# Patient Record
Sex: Male | Born: 1975 | Race: Black or African American | Hispanic: No | Marital: Single | Smoking: Light tobacco smoker
Health system: Southern US, Community
[De-identification: ages and names within clinical notes are randomized; demographics above are authoritative.]

## PROBLEM LIST (undated history)

## (undated) DIAGNOSIS — B20 Human immunodeficiency virus [HIV] disease: Secondary | ICD-10-CM

## (undated) DIAGNOSIS — B029 Zoster without complications: Secondary | ICD-10-CM

## (undated) HISTORY — DX: Human immunodeficiency virus (HIV) disease: B20

---

## 2001-05-25 ENCOUNTER — Emergency Department (HOSPITAL_COMMUNITY): Admission: EM | Admit: 2001-05-25 | Discharge: 2001-05-25 | Payer: Self-pay

## 2002-01-12 ENCOUNTER — Emergency Department (HOSPITAL_COMMUNITY): Admission: EM | Admit: 2002-01-12 | Discharge: 2002-01-13 | Payer: Self-pay | Admitting: Emergency Medicine

## 2004-10-18 ENCOUNTER — Emergency Department (HOSPITAL_COMMUNITY): Admission: EM | Admit: 2004-10-18 | Discharge: 2004-10-18 | Payer: Self-pay | Admitting: Emergency Medicine

## 2004-12-09 ENCOUNTER — Emergency Department (HOSPITAL_COMMUNITY): Admission: EM | Admit: 2004-12-09 | Discharge: 2004-12-09 | Payer: Self-pay | Admitting: Emergency Medicine

## 2005-01-01 ENCOUNTER — Emergency Department (HOSPITAL_COMMUNITY): Admission: EM | Admit: 2005-01-01 | Discharge: 2005-01-01 | Payer: Self-pay | Admitting: Emergency Medicine

## 2009-10-30 ENCOUNTER — Emergency Department (HOSPITAL_COMMUNITY): Admission: EM | Admit: 2009-10-30 | Discharge: 2009-10-31 | Payer: Self-pay | Admitting: Emergency Medicine

## 2013-08-01 ENCOUNTER — Ambulatory Visit: Payer: Self-pay | Attending: Internal Medicine | Admitting: Internal Medicine

## 2013-08-01 ENCOUNTER — Encounter: Payer: Self-pay | Admitting: Internal Medicine

## 2013-08-01 VITALS — BP 148/101 | HR 81 | Temp 98.4°F | Resp 16 | Ht 67.0 in | Wt 138.0 lb

## 2013-08-01 DIAGNOSIS — A64 Unspecified sexually transmitted disease: Secondary | ICD-10-CM

## 2013-08-01 DIAGNOSIS — Z21 Asymptomatic human immunodeficiency virus [HIV] infection status: Secondary | ICD-10-CM

## 2013-08-01 DIAGNOSIS — Z09 Encounter for follow-up examination after completed treatment for conditions other than malignant neoplasm: Secondary | ICD-10-CM | POA: Insufficient documentation

## 2013-08-01 LAB — CBC WITH DIFFERENTIAL/PLATELET
Basophils Absolute: 0 10*3/uL (ref 0.0–0.1)
Basophils Relative: 0 % (ref 0–1)
Eosinophils Absolute: 0.1 10*3/uL (ref 0.0–0.7)
Eosinophils Relative: 4 % (ref 0–5)
HCT: 42.6 % (ref 39.0–52.0)
Hemoglobin: 14.6 g/dL (ref 13.0–17.0)
Lymphocytes Relative: 46 % (ref 12–46)
Lymphs Abs: 1.5 10*3/uL (ref 0.7–4.0)
MCH: 28.2 pg (ref 26.0–34.0)
MCHC: 34.3 g/dL (ref 30.0–36.0)
MCV: 82.4 fL (ref 78.0–100.0)
Monocytes Absolute: 0.4 10*3/uL (ref 0.1–1.0)
Monocytes Relative: 13 % — ABNORMAL HIGH (ref 3–12)
Neutro Abs: 1.2 10*3/uL — ABNORMAL LOW (ref 1.7–7.7)
Neutrophils Relative %: 37 % — ABNORMAL LOW (ref 43–77)
Platelets: 280 10*3/uL (ref 150–400)
RBC: 5.17 MIL/uL (ref 4.22–5.81)
RDW: 13.4 % (ref 11.5–15.5)
WBC: 3.2 10*3/uL — ABNORMAL LOW (ref 4.0–10.5)

## 2013-08-01 LAB — POCT URINALYSIS DIPSTICK
Bilirubin, UA: NEGATIVE
Glucose, UA: NEGATIVE
KETONES UA: NEGATIVE
LEUKOCYTES UA: NEGATIVE
Nitrite, UA: NEGATIVE
PROTEIN UA: 3
Spec Grav, UA: <=1.005
Urobilinogen, UA: 1
pH, UA: >=9

## 2013-08-01 MED ORDER — CEFTRIAXONE SODIUM 1 G IJ SOLR: 250.0000 mg | INTRAMUSCULAR | Status: DC

## 2013-08-01 MED ORDER — CEFTRIAXONE SODIUM 1 G IJ SOLR
250.0000 mg | Freq: Once | INTRAMUSCULAR | Status: AC
  Administered 2013-08-01: 250 mg via INTRAMUSCULAR

## 2013-08-01 MED ORDER — DOXYCYCLINE HYCLATE 100 MG PO TABS: 100.0000 mg | ORAL_TABLET | Freq: Two times a day (BID) | ORAL | Status: AC

## 2013-08-01 MED ORDER — CEFTRIAXONE SODIUM 250 MG IJ SOLR: 250.0000 mg | Freq: Once | INTRAMUSCULAR | Status: DC

## 2013-08-01 NOTE — Addendum Note (Signed)
Addended by: Lestine MountJUAREZ, Mohogany Toppins L on: 08/01/2013 04:51 PM   Modules accepted: Orders

## 2013-08-01 NOTE — Progress Notes (Signed)
Pt is here to establish care. Pt is here to check to see if he might have an STD.

## 2013-08-01 NOTE — Addendum Note (Signed)
Addended by: Allayne StackWINFREE, Satoya Feeley R on: 08/01/2013 03:24 PM   Modules accepted: Orders, Medications

## 2013-08-01 NOTE — Addendum Note (Signed)
Addended by: Lestine MountJUAREZ, Avarie Tavano L on: 08/01/2013 04:46 PM   Modules accepted: Orders

## 2013-08-01 NOTE — Progress Notes (Signed)
Patient ID: Stephen Odom, male   DOB: [birth year], 38 y.o.   MRN: [id number]   CC:  HPI:  38 year old male concerned about sexually transmitted disease. He is gay, and has been in a monogamous relationship since September. Before that he was with a different person, he is not sure if he contracted gonorrhea but his sexual partner was diagnosed with gonorrhea. No skin lesions, no abnormal anal or penile discharge. No fever, no dysuria   No Known Allergies History reviewed. No pertinent past medical history. No current outpatient prescriptions on file prior to visit.   No current facility-administered medications on file prior to visit.   Family History  Problem Relation Age of Onset  . Diabetes Mother   . Hypertension Mother   . Diabetes Maternal Grandmother    History   Social History  . Marital Status: Married    Spouse Name: N/A    Number of Children: N/A  . Years of Education: N/A   Occupational History  . Not on file.   Social History Main Topics  . Smoking status: Light Tobacco Smoker -- 0.30 packs/day    Types: Cigarettes  . Smokeless tobacco: Not on file  . Alcohol Use: 0.5 oz/week    1 drink(s) per week  . Drug Use: 3.00 per week    Special: Marijuana  . Sexual Activity: Yes   Other Topics Concern  . Not on file   Social History Narrative  . No narrative on file    Review of Systems  Constitutional: As in history of present illness  HENT: Negative for ear pain, nosebleeds, congestion, facial swelling, rhinorrhea, neck pain, neck stiffness and ear discharge.   Eyes: Negative for pain, discharge, redness, itching and visual disturbance.  Respiratory: Negative for cough, choking, chest tightness, shortness of breath, wheezing and stridor.   Cardiovascular: Negative for chest pain, palpitations and leg swelling.  Gastrointestinal: Negative for abdominal distention.  Genitourinary: Negative for dysuria, urgency, frequency, hematuria, flank pain,  decreased urine volume, difficulty urinating and dyspareunia.  Musculoskeletal: Negative for back pain, joint swelling, arthralgias and gait problem.  Neurological: Negative for dizziness, tremors, seizures, syncope, facial asymmetry, speech difficulty, weakness, light-headedness, numbness and headaches.  Hematological: Negative for adenopathy. Does not bruise/bleed easily.  Psychiatric/Behavioral: Negative for hallucinations, behavioral problems, confusion, dysphoric mood, decreased concentration and agitation.    Objective:   Filed Vitals:   08/01/13 1429  BP: 148/101  Pulse: 81  Temp: 98.4 F (36.9 C)  Resp: 16    Physical Exam  Constitutional: Appears well-developed and well-nourished. No distress.  HENT: Normocephalic. External right and left ear normal. Oropharynx is clear and moist.  Eyes: Conjunctivae and EOM are normal. PERRLA, no scleral icterus.  Neck: Normal ROM. Neck supple. No JVD. No tracheal deviation. No thyromegaly.  CVS: RRR, S1/S2 +, no murmurs, no gallops, no carotid bruit.  Pulmonary: Effort and breath sounds normal, no stridor, rhonchi, wheezes, rales.  Abdominal: Soft. BS +,  no distension, tenderness, rebound or guarding.  Musculoskeletal: Normal range of motion. No edema and no tenderness.  Lymphadenopathy: No lymphadenopathy noted, cervical, inguinal. Neuro: Alert. Normal reflexes, muscle tone coordination. No cranial nerve deficit. Skin: Skin is warm and dry. No rash noted. Not diaphoretic. No erythema. No pallor.  Psychiatric: Normal mood and affect. Behavior, judgment, thought content normal.   No results found for this basename: WBC, HGB, HCT, MCV, PLT   No results found for this basename: CREATININE, BUN, NA, K, CL, CO2    No results  found for this basename: HGBA1C   Lipid Panel  No results found for this basename: chol, trig, hdl, cholhdl, vldl, ldlcalc       Assessment and plan:   There are no active problems to display for this  patient.  Sexually transmitted disease? Obtain chlamydia/gonorrhea PCR Urine dipstick HIV antibody CBC Rocephin 250 mg IM x1 Doxycycline 100 mg by mouth twice a day x7 days  Patient advised to use contact contraception  Followup in 6 months       The patient was given clear instructions to go to ER or return to medical center if symptoms don't improve, worsen or new problems develop. The patient verbalized understanding. The patient was told to call to get any lab results if not heard anything in the next week.

## 2013-08-01 NOTE — Addendum Note (Signed)
Addended by: Allayne StackWINFREE, Kaisyn Millea R on: 08/01/2013 03:21 PM   Modules accepted: Orders

## 2013-08-01 NOTE — Addendum Note (Signed)
Addended by: Allayne StackWINFREE, Ciera Beckum R on: 08/01/2013 03:27 PM   Modules accepted: Orders

## 2013-08-02 LAB — COMPLETE METABOLIC PANEL WITH GFR
ALT: 10 U/L (ref 0–53)
AST: 17 U/L (ref 0–37)
Albumin: 4.3 g/dL (ref 3.5–5.2)
Alkaline Phosphatase: 49 U/L (ref 39–117)
BUN: 14 mg/dL (ref 6–23)
CO2: 31 mEq/L (ref 19–32)
Calcium: 9.6 mg/dL (ref 8.4–10.5)
Chloride: 101 mEq/L (ref 96–112)
Creat: 0.83 mg/dL (ref 0.50–1.35)
GFR, Est African American: >89 mL/min
GFR, Est Non African American: >89 mL/min
Glucose, Bld: 71 mg/dL (ref 70–99)
Potassium: 4.5 mEq/L (ref 3.5–5.3)
Sodium: 138 mEq/L (ref 135–145)
Total Bilirubin: 0.6 mg/dL (ref 0.3–1.2)
Total Protein: 7.9 g/dL (ref 6.0–8.3)

## 2013-08-02 LAB — HIV ANTIBODY (ROUTINE TESTING W REFLEX): HIV: REACTIVE

## 2013-08-06 LAB — HIV 1/2 CONFIRMATION
HIV-1 antibody: POSITIVE — AB
HIV-2 Ab: NEGATIVE

## 2013-08-14 ENCOUNTER — Telehealth: Payer: Self-pay | Admitting: Emergency Medicine

## 2013-08-14 MED ORDER — CETIRIZINE HCL 5 MG/5ML PO SYRP: 5.0000 mg | ORAL_SOLUTION | Freq: Every day | ORAL | Status: DC

## 2013-08-14 NOTE — Telephone Encounter (Signed)
Spoke with pt to discuss Hiv lab results and further instructions. Pt has walk in appt 08/15/13 with me

## 2013-08-14 NOTE — Telephone Encounter (Signed)
Message copied by Darlis LoanSMITH, JILL D on Tue Aug 14, 2013  2:01 PM ------      Message from: Susie CassetteABROL MD, Germain OsgoodNAYANA      Created: Tue Aug 14, 2013  1:41 PM       Notify patient of the patient's HIV antibody test is positive, confirmation test is also positive, he has been referred to infectious disease on an urgent basis. Please provide him nora's contact number, so that he can quickly arrange for this appointment ------

## 2013-08-14 NOTE — Telephone Encounter (Signed)
Message copied by Darlis LoanSMITH, JILL D on Tue Aug 14, 2013  1:57 PM ------      Message from: Susie CassetteABROL MD, [facility]      Created: Tue Aug 14, 2013  1:41 PM       Notify patient of the patient's HIV antibody test is positive, confirmation test is also positive, he has been referred to infectious disease on an urgent basis. Please provide him nora's contact number, so that he can quickly arrange for this appointment ------

## 2013-08-14 NOTE — Addendum Note (Signed)
Addended by: Susie CassetteABROL MD, Germain OsgoodNAYANA on: 08/14/2013 01:40 PM   Modules accepted: Orders

## 2013-08-14 NOTE — Telephone Encounter (Signed)
Left message on voicemail for pt to call clinic.

## 2013-08-15 ENCOUNTER — Ambulatory Visit: Payer: Self-pay

## 2013-08-15 ENCOUNTER — Telehealth: Payer: Self-pay | Admitting: Emergency Medicine

## 2013-08-15 NOTE — Telephone Encounter (Signed)
Left message to reschedule office visit appt to discuss lab results

## 2013-08-16 ENCOUNTER — Encounter: Payer: Self-pay | Admitting: Internal Medicine

## 2013-08-16 NOTE — Progress Notes (Unsigned)
Pt here to discuss lab results Lab results and resource information given  Infectious disease referral placed Pt informed to tell sexual partner

## 2013-08-16 NOTE — Telephone Encounter (Signed)
Error

## 2013-08-22 ENCOUNTER — Ambulatory Visit: Payer: Self-pay

## 2013-09-07 ENCOUNTER — Telehealth: Payer: Self-pay

## 2013-09-07 NOTE — Telephone Encounter (Signed)
[facility]  There is no documentation that  verifies patient has been notified of HIV status.  I will contact the office for more information.  Once this is documented I will contact the patient for a new intake with [facility].   Laurell Josephsammy K Jaque Dacy, RN

## 2013-09-17 ENCOUNTER — Telehealth: Payer: Self-pay | Admitting: Emergency Medicine

## 2013-12-31 ENCOUNTER — Telehealth: Payer: Self-pay

## 2013-12-31 DIAGNOSIS — Z21 Asymptomatic human immunodeficiency virus [HIV] infection status: Secondary | ICD-10-CM | POA: Insufficient documentation

## 2013-12-31 NOTE — Telephone Encounter (Signed)
Multiple attempts to reach patient via phone.  Number listed  [phone] does not belong to patient.  A male was newly assigned this nmber and asked that it be removed from his records.   I will assign [facility] Counselor to look for patient.   Laurell Josephs, RN

## 2014-01-29 ENCOUNTER — Ambulatory Visit: Payer: Self-pay | Admitting: Internal Medicine

## 2015-02-09 ENCOUNTER — Encounter (HOSPITAL_COMMUNITY): Payer: Self-pay

## 2015-02-09 ENCOUNTER — Emergency Department (HOSPITAL_COMMUNITY)
Admission: EM | Admit: 2015-02-09 | Discharge: 2015-02-09 | Disposition: A | Discharge status: Home / Self Care | Payer: Self-pay | Attending: Emergency Medicine | Admitting: Emergency Medicine

## 2015-02-09 DIAGNOSIS — M545 Low back pain, unspecified: Secondary | ICD-10-CM

## 2015-02-09 DIAGNOSIS — Z720 Tobacco use: Secondary | ICD-10-CM | POA: Insufficient documentation

## 2015-02-09 DIAGNOSIS — Z79899 Other long term (current) drug therapy: Secondary | ICD-10-CM | POA: Insufficient documentation

## 2015-02-09 DIAGNOSIS — R05 Cough: Secondary | ICD-10-CM | POA: Insufficient documentation

## 2015-02-09 MED ORDER — CYCLOBENZAPRINE HCL 10 MG PO TABS: 10.0000 mg | ORAL_TABLET | Freq: Two times a day (BID) | ORAL | Status: DC | PRN

## 2015-02-09 MED ORDER — CYCLOBENZAPRINE HCL 10 MG PO TABS
10.0000 mg | ORAL_TABLET | Freq: Once | ORAL | Status: AC
  Administered 2015-02-09: 10 mg via ORAL
  Filled 2015-02-09: qty 1

## 2015-02-09 MED ORDER — IBUPROFEN 800 MG PO TABS
800.0000 mg | ORAL_TABLET | Freq: Once | ORAL | Status: AC
  Administered 2015-02-09: 800 mg via ORAL
  Filled 2015-02-09: qty 1

## 2015-02-09 MED ORDER — HYDROCODONE-ACETAMINOPHEN 5-325 MG PO TABS
1.0000 | ORAL_TABLET | ORAL | Status: DC | PRN
Start: 2015-02-09 — End: 2015-02-18

## 2015-02-09 MED ORDER — GUAIFENESIN 100 MG/5ML PO SYRP: 100.0000 mg | ORAL_SOLUTION | ORAL | Status: DC | PRN

## 2015-02-09 MED ORDER — IBUPROFEN 800 MG PO TABS: 800.0000 mg | ORAL_TABLET | Freq: Three times a day (TID) | ORAL | Status: DC

## 2015-02-09 NOTE — Discharge Instructions (Signed)
Back Exercises Back exercises help treat and prevent back injuries. The goal is to increase your strength in your belly (abdominal) and back muscles. These exercises can also help with flexibility. Start these exercises when told by your doctor. HOME CARE Back exercises include: Pelvic Tilt.  Lie on your back with your knees bent. Tilt your pelvis until the lower part of your back is against the floor. Hold this position 5 to 10 sec. Repeat this exercise 5 to 10 times. Knee to Chest.  Pull 1 knee up against your chest and hold for 20 to 30 seconds. Repeat this with the other knee. This may be done with the other leg straight or bent, whichever feels better. Then, pull both knees up against your chest. Sit-Ups or Curl-Ups.  Bend your knees 90 degrees. Start with tilting your pelvis, and do a partial, slow sit-up. Only lift your upper half 30 to 45 degrees off the floor. Take at least 2 to 3 seonds for each sit-up. Do not do sit-ups with your knees out straight. If partial sit-ups are difficult, simply do the above but with only tightening your belly (abdominal) muscles and holding it as told. Hip-Lift.  Lie on your back with your knees flexed 90 degrees. Push down with your feet and shoulders as you raise your hips 2 inches off the floor. Hold for 10 seconds, repeat 5 to 10 times. Back Arches.  Lie on your stomach. Prop yourself up on bent elbows. Slowly press on your hands, causing an arch in your low back. Repeat 3 to 5 times. Shoulder-Lifts.  Lie face down with arms beside your body. Keep hips and belly pressed to floor as you slowly lift your head and shoulders off the floor. Do not overdo your exercises. Be careful in the beginning. Exercises may cause you some mild back discomfort. If the pain lasts for more than 15 minutes, stop the exercises until you see your doctor. Improvement with exercise for back problems is slow.  Document Released: 08/14/2010 Document Revised: 10/04/2011  Document Reviewed: 05/13/2011 [facility] Patient Information 2015 [place], [place]. This information is not intended to replace advice given to you by your health care provider. Make sure you discuss any questions you have with your health care provider. Back Pain, Adult Low back pain is very common. About 1 in 5 people have back pain.The cause of low back pain is rarely dangerous. The pain often gets better over time.About half of people with a sudden onset of back pain feel better in just 2 weeks. About 8 in 10 people feel better by 6 weeks.  CAUSES Some common causes of back pain include:  Strain of the muscles or ligaments supporting the spine.  Wear and tear (degeneration) of the spinal discs.  Arthritis.  Direct injury to the back. DIAGNOSIS Most of the time, the direct cause of low back pain is not known.However, back pain can be treated effectively even when the exact cause of the pain is unknown.Answering your caregiver's questions about your overall health and symptoms is one of the most accurate ways to make sure the cause of your pain is not dangerous. If your caregiver needs more information, he or she may order lab work or imaging tests (X-rays or MRIs).However, even if imaging tests show changes in your back, this usually does not require surgery. HOME CARE INSTRUCTIONS For many people, back pain returns.Since low back pain is rarely dangerous, it is often a condition that people can learn to [facility] their  own.   Remain active. It is stressful on the back to sit or stand in one place. Do not sit, drive, or stand in one place for more than 30 minutes at a time. Take short walks on level surfaces as soon as pain allows.Try to increase the length of time you walk each day.  Do not stay in bed.Resting more than 1 or 2 days can delay your recovery.  Do not avoid exercise or work.Your body is made to move.It is not dangerous to be active, even though your back may hurt.Your  back will likely heal faster if you return to being active before your pain is gone.  Pay attention to your body when you bend and lift. Many people have less discomfortwhen lifting if they bend their knees, keep the load close to their bodies,and avoid twisting. Often, the most comfortable positions are those that put less stress on your recovering back.  Find a comfortable position to sleep. Use a firm mattress and lie on your side with your knees slightly bent. If you lie on your back, put a pillow under your knees.  Only take over-the-counter or prescription medicines as directed by your caregiver. Over-the-counter medicines to reduce pain and inflammation are often the most helpful.Your caregiver may prescribe muscle relaxant drugs.These medicines help dull your pain so you can more quickly return to your normal activities and healthy exercise.  Put ice on the injured area.  Put ice in a plastic bag.  Place a towel between your skin and the bag.  Leave the ice on for 15-20 minutes, 03-04 times a day for the first 2 to 3 days. After that, ice and heat may be alternated to reduce pain and spasms.  Ask your caregiver about trying back exercises and gentle massage. This may be of some benefit.  Avoid feeling anxious or stressed.Stress increases muscle tension and can worsen back pain.It is important to recognize when you are anxious or stressed and learn ways to manage it.Exercise is a great option. SEEK MEDICAL CARE IF:  You have pain that is not relieved with rest or medicine.  You have pain that does not improve in 1 week.  You have new symptoms.  You are generally not feeling well. SEEK IMMEDIATE MEDICAL CARE IF:   You have pain that radiates from your back into your legs.  You develop new bowel or bladder control problems.  You have unusual weakness or numbness in your arms or legs.  You develop nausea or vomiting.  You develop abdominal pain.  You feel  faint. Document Released: 07/12/2005 Document Revised: 01/11/2012 Document Reviewed: 11/13/2013 [facility] Patient Information 2015 [place], [place]. This information is not intended to replace advice given to you by your health care provider. Make sure you discuss any questions you have with your health care provider. Cryotherapy Cryotherapy means treatment with cold. Ice or gel packs can be used to reduce both pain and swelling. Ice is the most helpful within the first 24 to 48 hours after an injury or flare-up from overusing a muscle or joint. Sprains, strains, spasms, burning pain, shooting pain, and aches can all be eased with ice. Ice can also be used when recovering from surgery. Ice is effective, has very few side effects, and is safe for most people to use. PRECAUTIONS  Ice is not a safe treatment option for people with:  Raynaud phenomenon. This is a condition affecting small blood vessels in the extremities. Exposure to cold may cause your problems  to return.  Cold hypersensitivity. There are many forms of cold hypersensitivity, including:  Cold urticaria. Red, itchy hives appear on the skin when the tissues begin to warm after being iced.  Cold erythema. This is a red, itchy rash caused by exposure to cold.  Cold hemoglobinuria. Red blood cells break down when the tissues begin to warm after being iced. The hemoglobin that carry oxygen are passed into the urine because they cannot combine with blood proteins fast enough.  Numbness or altered sensitivity in the area being iced. If you have any of the following conditions, do not use ice until you have discussed cryotherapy with your caregiver:  Heart conditions, such as arrhythmia, angina, or chronic heart disease.  High blood pressure.  Healing wounds or open skin in the area being iced.  Current infections.  Rheumatoid arthritis.  Poor circulation.  Diabetes. Ice slows the blood flow in the region it is applied. This is  beneficial when trying to stop inflamed tissues from spreading irritating chemicals to surrounding tissues. However, if you expose your skin to cold temperatures for too long or without the proper protection, you can damage your skin or nerves. Watch for signs of skin damage due to cold. HOME CARE INSTRUCTIONS Follow these tips to use ice and cold packs safely.  Place a dry or damp towel between the ice and skin. A damp towel will cool the skin more quickly, so you may need to shorten the time that the ice is used.  For a more rapid response, add gentle compression to the ice.  Ice for no more than 10 to 20 minutes at a time. The bonier the area you are icing, the less time it will take to get the benefits of ice.  Check your skin after 5 minutes to make sure there are no signs of a poor response to cold or skin damage.  Rest 20 minutes or more between uses.  Once your skin is numb, you can end your treatment. You can test numbness by very lightly touching your skin. The touch should be so light that you do not see the skin dimple from the pressure of your fingertip. When using ice, most people will feel these normal sensations in this order: cold, burning, aching, and numbness.  Do not use ice on someone who cannot communicate their responses to pain, such as small children or people with dementia. HOW TO MAKE AN ICE PACK Ice packs are the most common way to use ice therapy. Other methods include ice massage, ice baths, and cryosprays. Muscle creams that cause a cold, tingly feeling do not offer the same benefits that ice offers and should not be used as a substitute unless recommended by your caregiver. To make an ice pack, do one of the following:  Place crushed ice or a bag of frozen vegetables in a sealable plastic bag. Squeeze out the excess air. Place this bag inside another plastic bag. Slide the bag into a pillowcase or place a damp towel between your skin and the bag.  Mix 3 parts  water with 1 part rubbing alcohol. Freeze the mixture in a sealable plastic bag. When you remove the mixture from the freezer, it will be slushy. Squeeze out the excess air. Place this bag inside another plastic bag. Slide the bag into a pillowcase or place a damp towel between your skin and the bag. SEEK MEDICAL CARE IF:  You develop white spots on your skin. This may give the skin  a blotchy (mottled) appearance.  Your skin turns blue or pale.  Your skin becomes waxy or hard.  Your swelling gets worse. MAKE SURE YOU:   Understand these instructions.  Will watch your condition.  Will get help right away if you are not doing well or get worse. Document Released: 03/08/2011 Document Revised: 11/26/2013 Document Reviewed: 03/08/2011 [facility] Patient Information 2015 [place], [place]. This information is not intended to replace advice given to you by your health care provider. Make sure you discuss any questions you have with your health care provider.

## 2015-02-09 NOTE — ED Provider Notes (Signed)
CSN: [id number]     Arrival date & time 02/09/15  1009 History   First MD Initiated Contact with Patient 02/09/15 1026     Chief Complaint  Patient presents with  . Back Pain     (Consider location/radiation/quality/duration/timing/severity/associated sxs/prior Treatment) HPI Comments: Patient presents with low back pain x 2 days. Patient states the back pain started when he developed a cough. The pain is located in the lumbar region and does not radiate. Pain is a 7/10 and constant, but is worsened while coughing and increases to a 10/10. He has tried Advil with no relief. He denies any trauma. He denies numbness, tingling, shooting pain and bowel or bladder dysfunction.   Patient is a 39 y.o. male presenting with back pain. The history is provided by the patient.  Back Pain Location:  Lumbar spine Radiates to:  Does not radiate Duration:  2 days Timing:  Constant Chronicity:  New Context: not falling   Relieved by:  Nothing Worsened by:  Coughing Associated symptoms: no dysuria, no numbness and no weakness     History reviewed. No pertinent past medical history. No past surgical history on file. Family History  Problem Relation Age of Onset  . Diabetes Mother   . Hypertension Mother   . Diabetes Maternal Grandmother    History  Substance Use Topics  . Smoking status: Light Tobacco Smoker -- 0.30 packs/day    Types: Cigarettes  . Smokeless tobacco: Not on file  . Alcohol Use: 0.5 oz/week    1 drink(s) per week    Review of Systems  Respiratory: Positive for cough.   Gastrointestinal:       Negative for bowel dysfunction  Genitourinary: Negative for dysuria and enuresis.  Musculoskeletal: Positive for back pain.  Neurological: Negative for weakness and numbness.      Allergies  Review of patient's allergies indicates no known allergies.  Home Medications   Prior to Admission medications   Medication Sig Start Date End Date Taking? Authorizing Provider   cetirizine HCl (ZYRTEC) 5 MG/5ML SYRP Take 5 mLs (5 mg total) by mouth daily. 08/14/13   Richarda OverlieNayana Abrol, MD   BP 141/84 mmHg  Pulse 88  Temp(Src) 98.7 F (37.1 C) (Oral)  Resp 20  SpO2 97% Physical Exam  Constitutional: He is oriented to person, place, and time. He appears well-developed and well-nourished.  HENT:  Head: Normocephalic and atraumatic.  Cardiovascular: Normal rate, regular rhythm and normal heart sounds.   Pulmonary/Chest: Effort normal and breath sounds normal.  Abdominal: Soft. There is no tenderness.  Musculoskeletal: He exhibits tenderness (along the spinous process and paravertebral muscles in the lumbar region).  Neurological: He is alert and oriented to person, place, and time. He has normal reflexes.  Reflexes are equal bilateral LE's. Ambulatory without difficulty.    ED Course  Procedures (including critical care time) Labs Review Labs Reviewed - No data to display  Imaging Review No results found.   EKG Interpretation None      MDM   Final diagnoses:  None    1. Low back pain  No neurologic deficits on exam. Pain reproducible on exam, suspect muscular strain vs radiculopathy. Will treat conservatively with medications and rest. Ortho referral provided.    Elpidio AnisShari Jianni Batten, PA-C 02/09/15 1516  Lorre NickAnthony Allen, MD 02/11/15 1524

## 2015-02-09 NOTE — ED Notes (Signed)
He states he has mild uri sx, and with much soughing his low back is beginning to hurt.  He is in no distress.

## 2015-02-18 ENCOUNTER — Emergency Department (HOSPITAL_COMMUNITY)
Admission: EM | Admit: 2015-02-18 | Discharge: 2015-02-18 | Disposition: A | Discharge status: Home / Self Care | Payer: Self-pay | Attending: Emergency Medicine | Admitting: Emergency Medicine

## 2015-02-18 ENCOUNTER — Encounter (HOSPITAL_COMMUNITY): Payer: Self-pay | Admitting: Emergency Medicine

## 2015-02-18 DIAGNOSIS — Z720 Tobacco use: Secondary | ICD-10-CM | POA: Insufficient documentation

## 2015-02-18 DIAGNOSIS — M545 Low back pain: Secondary | ICD-10-CM | POA: Insufficient documentation

## 2015-02-18 DIAGNOSIS — B029 Zoster without complications: Secondary | ICD-10-CM | POA: Insufficient documentation

## 2015-02-18 DIAGNOSIS — D849 Immunodeficiency, unspecified: Secondary | ICD-10-CM | POA: Insufficient documentation

## 2015-02-18 DIAGNOSIS — Z21 Asymptomatic human immunodeficiency virus [HIV] infection status: Secondary | ICD-10-CM | POA: Insufficient documentation

## 2015-02-18 DIAGNOSIS — B20 Human immunodeficiency virus [HIV] disease: Secondary | ICD-10-CM

## 2015-02-18 MED ORDER — ACYCLOVIR 400 MG PO TABS: 800.0000 mg | ORAL_TABLET | Freq: Every day | ORAL | Status: DC

## 2015-02-18 MED ORDER — HYDROCODONE-ACETAMINOPHEN 5-325 MG PO TABS: 1.0000 | ORAL_TABLET | ORAL | Status: DC | PRN

## 2015-02-18 MED ORDER — NAPROXEN 250 MG PO TABS: 250.0000 mg | ORAL_TABLET | Freq: Two times a day (BID) | ORAL | Status: DC

## 2015-02-18 MED ORDER — IBUPROFEN 800 MG PO TABS
800.0000 mg | ORAL_TABLET | Freq: Once | ORAL | Status: AC
  Administered 2015-02-18: 800 mg via ORAL
  Filled 2015-02-18: qty 1

## 2015-02-18 MED ORDER — HYDROCODONE-ACETAMINOPHEN 5-325 MG PO TABS
1.0000 | ORAL_TABLET | Freq: Once | ORAL | Status: AC
  Administered 2015-02-18: 1 via ORAL
  Filled 2015-02-18: qty 1

## 2015-02-18 NOTE — ED Notes (Signed)
Pt reports shingles rash for last month. Was here Sunday for same and was given rx for pain medication. Ran out of medication. Pt has un-scabbed over blisters on R flank. Gave pt mask to wear.

## 2015-02-18 NOTE — Discharge Instructions (Signed)
Shingles °Shingles (herpes zoster) is an infection that is caused by the same virus that causes chickenpox (varicella). The infection causes a painful skin rash and fluid-filled blisters, which eventually break open, crust over, and heal. It may occur in any area of the body, but it usually affects only one side of the body or face. The pain of shingles usually lasts about 1 month. However, some people with shingles may develop long-term (chronic) pain in the affected area of the body. °Shingles often occurs many years after the person had chickenpox. It is more common: °· In people older than 50 years. °· In people with weakened immune systems, such as those with HIV, AIDS, or cancer. °· In people taking medicines that weaken the immune system, such as transplant medicines. °· In people under great stress. °CAUSES  °Shingles is caused by the varicella zoster virus (VZV), which also causes chickenpox. After a person is infected with the virus, it can remain in the person's body for years in an inactive state (dormant). To cause shingles, the virus reactivates and breaks out as an infection in a nerve root. °The virus can be spread from person to person (contagious) through contact with open blisters of the shingles rash. It will only spread to people who have not had chickenpox. When these people are exposed to the virus, they may develop chickenpox. They will not develop shingles. Once the blisters scab over, the person is no longer contagious and cannot spread the virus to others. °SIGNS AND SYMPTOMS  °Shingles shows up in stages. The initial symptoms may be pain, itching, and tingling in an area of the skin. This pain is usually described as burning, stabbing, or throbbing. In a few days or weeks, a painful red rash will appear in the area where the pain, itching, and tingling were felt. The rash is usually on one side of the body in a band or belt-like pattern. Then, the rash usually turns into fluid-filled  blisters. They will scab over and dry up in approximately 2-3 weeks. °Flu-like symptoms may also occur with the initial symptoms, the rash, or the blisters. These may include: °· Fever. °· Chills. °· Headache. °· Upset stomach. °DIAGNOSIS  °Your health care provider will perform a skin exam to diagnose shingles. Skin scrapings or fluid samples may also be taken from the blisters. This sample will be examined under a microscope or sent to a lab for further testing. °TREATMENT  °There is no specific cure for shingles. Your health care provider will likely prescribe medicines to help you manage the pain, recover faster, and avoid long-term problems. This may include antiviral drugs, anti-inflammatory drugs, and pain medicines. °HOME CARE INSTRUCTIONS  °· Take a cool bath or apply cool compresses to the area of the rash or blisters as directed. This may help with the pain and itching.   °· Take medicines only as directed by your health care provider.   °· Rest as directed by your health care provider. °· Keep your rash and blisters clean with mild soap and cool water or as directed by your health care provider.  °· Do not pick your blisters or scratch your rash. Apply an anti-itch cream or numbing creams to the affected area as directed by your health care provider. °· Keep your shingles rash covered with a loose bandage (dressing). °· Avoid skin contact with: °· Babies.   °· Pregnant women.   °· Children with eczema.   °· Elderly people with transplants.   °· People with chronic illnesses, such as leukemia   or AIDS.   Wear loose-fitting clothing to help ease the pain of material rubbing against the rash.  Keep all follow-up visits as directed by your health care provider.If the area involved is on your face, you may receive a referral for a specialist, such as an eye doctor (ophthalmologist) or an ear, nose, and throat (ENT) doctor. Keeping all follow-up visits will help you avoid eye problems, chronic pain, or  disability.  SEEK IMMEDIATE MEDICAL CARE IF:   You have facial pain, pain around the eye area, or loss of feeling on one side of your face.  You have ear pain or ringing in your ear.  You have loss of taste.  Your pain is not relieved with prescribed medicines.   Your redness or swelling spreads.   You have more pain and swelling.  Your condition is worsening or has changed.   You have a fever. MAKE SURE YOU:  Understand these instructions.  Will watch your condition.  Will get help right away if you are not doing well or get worse. Document Released: 07/12/2005 Document Revised: 11/26/2013 Document Reviewed: 02/24/2012 [facility] Patient Information 2015 [place], [place]. This information is not intended to replace advice given to you by your health care provider. Make sure you discuss any questions you have with your health care provider. AIDS AIDS (Acquired Immune Deficiency Syndrome) is a severe viral infection caused by the Human Immunodeficiency Virus (HIV). This virus destroys a person's resistance to disease and certain cancers. It is transmitted through blood, blood products, and body fluids. Although the fear of AIDS has grown faster than the epidemic, it is important to note that AIDS is not spread by casual contact and is easily killed by hot water, soap, bleach, and most antiseptics. HOW THE AIDS INFECTION WORKS Once HIV enters the body it affects T-helper (T-4) lymphocytes. These are white blood cells that are crucial for the immune system. Once they become infected, they become factories for producing the AIDS Virus. Eventually these infected T-4 cells die. This leaves the victim susceptible to infection and certain cancers. While anyone can get AIDS, you are unlikely to get this disease unless you indulge in high-risk behavior. Some of these high-risk behaviors are promiscuous sex, having close relationships with HIV-positive people, and the sharing of needles. This  illness was initially more common in homosexual men, but as time progresses, it will most probably affect an equal number of men and women. Babies born to women who are infected, have greater chances of getting AIDS. Infection with HIV may cause a brief, mild illness with fever several weeks after contact. More serious symptoms do not develop until months or years later, so a person can be infected with the virus without showing any symptoms at all. At this stage of the infection there is no way of knowing you are infected unless you have a blood or mouth scraping test for the AIDS virus. SYMPTOMS   Fevers, night sweats, general weakness, enlarged lymph nodes.  Chest pain, pneumonia, chronic cough, shortness of breath.  Weight loss, diarrhea, difficulty swallowing, rectal problems.  Headaches, personality changes, problems with vision and memory.  Skin tumors (patchy dark areas) and infections. There is no cure or vaccine for AIDS at the present time. Anti-viral antibiotic drugs have been shown to stop the virus from multiplying, which helps prolong health. The best treatment against AIDS is prevention. If you have been infected with HIV, careful medical follow-up with regular blood tests is necessary.  Do not  take part in risky behaviors. These include sharing needles and syringes or other sharp instruments like razors with others, having unprotected sex with high-risk people (homosexuals, bisexuals, or prostitutes), and engaging in anal sex (with or without a condom).  For further information about AIDS, please call your caregiver, the health department, or the Center for Disease Control: 800-342-AIDS. MISCONCEPTIONS ABOUT AIDS  You can not get AIDS through casual contact. This includes sitting next to an AIDS infected person, being coughed on, living with, swimming with, eating food prepared by, sitting or lying next to someone with AIDS.  It is not caught from toilet seats, from showers, bath  tubs, water fountains, phones, drinking glasses, or food touched or used by people with AIDS. Casual kissing will probably not transmit the disease. "[place] kissing" (putting one's tongue in another's mouth) is probably not a good idea as the AIDS Virus is present in saliva.  You will not get AIDS by donating blood. The needles used by blood banks are sterile and disposable.  There is no evidence that AIDS is transmitted through tears.  AIDS is not caught through mosquitoes.  Children with AIDS will not pass AIDS to other children in school without exchange of blood products or engaging in sex. In school, a child with AIDS is actually at greater risk because of their weak immune status. Their susceptibility to the viruses and germs (bacteria) carried by children without AIDS is great. TESTING FOR AIDS  An ELISA (enzyme-linked immunoabsorbent assay) is a blood test available to let you know if you have contracted AIDS. If this test is positive, it is usually repeated.  If positive a second time, a second test known as the Western blot test is performed. If the Western blot test is positive, it means you have been infected with HIV. PREVENTION  The best way to prevent AIDS is to avoid high-risk behavior.  The outlook for defeating AIDS is good. Millions of research dollars are being spent on creating a vaccine to prevent the disease as well as providing a cure. New drugs appear to be extremely effective at controlling the disease.  Your caregiver will educate you in all the most effective and current treatments. Document Released: 07/09/2000 Document Revised: 10/04/2011 Document Reviewed: 07/05/2008 [facility] Patient Information 2015 [place], [place]. This information is not intended to replace advice given to you by your health care provider. Make sure you discuss any questions you have with your health care provider.

## 2015-02-18 NOTE — ED Provider Notes (Signed)
CSN: [id number]     Arrival date & time 02/18/15  1651 History  This chart was scribed for non-physician practitioner, Everlene Farrier, PA-C working with Pricilla Loveless, MD, by Abel Presto, ED Scribe. This patient was seen in room WTR7/WTR7 and the patient's care was started at 7:01 PM.     Chief Complaint  Patient presents with  . Herpes Zoster     The history is provided by the patient. No language interpreter was used.   HPI Comments: Stephen Odom is a 39 y.o. male with PMHx of HIV infection who presents to the Emergency Department complaining of rash to right lateral abdomen and back with onset 2 days ago. He states rash began on back and moved to lateral abdomen. Pt has not been seen for rash yet. Pt was last seen in ED on 02/09/15 and evaluated for back pain and dx with pain medication. He states he has run out. He reports continued back pain. Pt took Advil for relief. He denies fever, chills, nausea, and vomiting, headache, numbness, tingling, weakness. He reports he informed of his positive HIV status previously but has not followed up for treatment. He tells me he was feeling well and did not feel that he needed treatment for this.   History reviewed. No pertinent past medical history. History reviewed. No pertinent past surgical history. Family History  Problem Relation Age of Onset  . Diabetes Mother   . Hypertension Mother   . Diabetes Maternal Grandmother    History  Substance Use Topics  . Smoking status: Light Tobacco Smoker -- 0.30 packs/day    Types: Cigarettes  . Smokeless tobacco: Not on file  . Alcohol Use: 0.5 oz/week    1 drink(s) per week    Review of Systems  Constitutional: Negative for fever and chills.  Gastrointestinal: Negative for nausea and vomiting.  Musculoskeletal: Positive for back pain. Negative for neck pain and neck stiffness.  Skin: Positive for rash.  Allergic/Immunologic: Positive for immunocompromised state.  Neurological: Negative  for syncope, weakness, light-headedness, numbness and headaches.      Allergies  Review of patient's allergies indicates no known allergies.  Home Medications   Prior to Admission medications   Medication Sig Start Date End Date Taking? Authorizing Provider  ibuprofen (ADVIL,MOTRIN) 200 MG tablet Take 400 mg by mouth every 6 (six) hours as needed for headache, mild pain or moderate pain.   Yes Historical Provider, MD  acyclovir (ZOVIRAX) 400 MG tablet Take 2 tablets (800 mg total) by mouth 5 (five) times daily. 02/18/15   Everlene Farrier, PA-C  cetirizine HCl (ZYRTEC) 5 MG/5ML SYRP Take 5 mLs (5 mg total) by mouth daily. Patient not taking: Reported on 02/18/2015 08/14/13   Richarda Overlie, MD  cyclobenzaprine (FLEXERIL) 10 MG tablet Take 1 tablet (10 mg total) by mouth 2 (two) times daily as needed for muscle spasms. Patient not taking: Reported on 02/18/2015 02/09/15   Elpidio Anis, PA-C  guaifenesin (ROBITUSSIN) 100 MG/5ML syrup Take 5-10 mLs (100-200 mg total) by mouth every 4 (four) hours as needed for cough. Patient not taking: Reported on 02/18/2015 02/09/15   Elpidio Anis, PA-C  HYDROcodone-acetaminophen (NORCO/VICODIN) 5-325 MG per tablet Take 1 tablet by mouth every 4 (four) hours as needed. 02/18/15   Everlene Farrier, PA-C  ibuprofen (ADVIL,MOTRIN) 800 MG tablet Take 1 tablet (800 mg total) by mouth 3 (three) times daily. Patient not taking: Reported on 02/18/2015 02/09/15   Elpidio Anis, PA-C  naproxen (NAPROSYN) 250 MG tablet Take 1  tablet (250 mg total) by mouth 2 (two) times daily with a meal. 02/18/15   Everlene Farrier, PA-C   BP 146/89 mmHg  Pulse 110  Temp(Src) 98.8 F (37.1 C) (Oral)  Resp 19  SpO2 100% Physical Exam  Constitutional: He appears well-developed and well-nourished. No distress.  Nontoxic appearing.  HENT:  Head: Normocephalic and atraumatic.  Mouth/Throat: No oropharyngeal exudate.  Eyes: Conjunctivae are normal. Right eye exhibits no discharge. Left eye  exhibits no discharge.  Neck: Neck supple.  Cardiovascular: Normal rate, regular rhythm and intact distal pulses.   HR: 88  Pulmonary/Chest: Effort normal. No respiratory distress.  Abdominal: Soft. There is no tenderness. There is no guarding.  Lymphadenopathy:    He has no cervical adenopathy.  Neurological: He is alert. Coordination normal.  Skin: Skin is warm and dry. Rash noted. He is not diaphoretic. There is erythema. No pallor.  Multiple erythematous vesicles to the patient's right back and right side of his abdomen a dermatomal pattern that does not cross the midline. Tender to touch.   Psychiatric: He has a normal mood and affect. His behavior is normal.  Nursing note and vitals reviewed.   ED Course  Procedures (including critical care time) DIAGNOSTIC STUDIES: Oxygen Saturation is 100% on room air, normal by my interpretation.    COORDINATION OF CARE: 7:04 PM Discussed treatment plan with patient at beside, the patient agrees with the plan and has no further questions at this time.   Labs Review Labs Reviewed - No data to display  Imaging Review No results found.   EKG Interpretation None      Filed Vitals:   02/18/15 1701  BP: 146/89  Pulse: 110  Temp: 98.8 F (37.1 C)  TempSrc: Oral  Resp: 19  SpO2: 100%     MDM   Meds given in ED:  Medications  HYDROcodone-acetaminophen (NORCO/VICODIN) 5-325 MG per tablet 1 tablet (1 tablet Oral Given 02/18/15 1921)  ibuprofen (ADVIL,MOTRIN) tablet 800 mg (800 mg Oral Given 02/18/15 1921)    New Prescriptions   ACYCLOVIR (ZOVIRAX) 400 MG TABLET    Take 2 tablets (800 mg total) by mouth 5 (five) times daily.   HYDROCODONE-ACETAMINOPHEN (NORCO/VICODIN) 5-325 MG PER TABLET    Take 1 tablet by mouth every 4 (four) hours as needed.   NAPROXEN (NAPROSYN) 250 MG TABLET    Take 1 tablet (250 mg total) by mouth 2 (two) times daily with a meal.    Final diagnoses:  Shingles  HIV (human immunodeficiency virus infection)    This is a 39 y.o. male with PMHx of HIV infection who presents to the Emergency Department complaining of rash to right lateral abdomen and back with onset 2 days ago. He states rash began on back and moved to lateral abdomen. The patient reports that he has been informed of his positive HIV status but is never followed up for treatment. He reports he was feeling well and did not feel he needed to be treated for this. On exam the patient is afebrile and nontoxic appearing. The patient has shingles rash to his right low back to his abdomen. It is in a dermatomal pattern and does not cross the midline. He does not appear to have disseminated shingles. I had a serious extensive conversation with the patient about his HIV status and his need for treatment. I encouraged him to follow-up with infectious disease and gave him information on infectious disease follow-up. He agrees to follow up. We will discharge the  patient with prescriptions for acyclovir, Norco and naproxen. strict return precautions provided. I advised the patient to follow-up with their primary care provider this week. I advised the patient to return to the emergency department with new or worsening symptoms or new concerns. The patient verbalized understanding and agreement with plan.    This patient was discussed with Dr. Criss Alvine who agrees with assessment and plan.  I personally performed the services described in this documentation, which was scribed in my presence. The recorded information has been reviewed and is accurate.         Everlene Farrier, PA-C 02/18/15 1927  Pricilla Loveless, MD 02/23/15 [phone]

## 2015-02-21 ENCOUNTER — Encounter (HOSPITAL_COMMUNITY): Payer: Self-pay | Admitting: Emergency Medicine

## 2015-02-21 ENCOUNTER — Emergency Department (HOSPITAL_COMMUNITY)
Admission: EM | Admit: 2015-02-21 | Discharge: 2015-02-22 | Disposition: A | Discharge status: Home / Self Care | Payer: Self-pay | Attending: Emergency Medicine | Admitting: Emergency Medicine

## 2015-02-21 DIAGNOSIS — Z79899 Other long term (current) drug therapy: Secondary | ICD-10-CM | POA: Insufficient documentation

## 2015-02-21 DIAGNOSIS — R112 Nausea with vomiting, unspecified: Secondary | ICD-10-CM | POA: Insufficient documentation

## 2015-02-21 DIAGNOSIS — Z720 Tobacco use: Secondary | ICD-10-CM | POA: Insufficient documentation

## 2015-02-21 DIAGNOSIS — R109 Unspecified abdominal pain: Secondary | ICD-10-CM | POA: Insufficient documentation

## 2015-02-21 DIAGNOSIS — Z791 Long term (current) use of non-steroidal anti-inflammatories (NSAID): Secondary | ICD-10-CM | POA: Insufficient documentation

## 2015-02-21 DIAGNOSIS — B029 Zoster without complications: Secondary | ICD-10-CM | POA: Insufficient documentation

## 2015-02-21 DIAGNOSIS — R197 Diarrhea, unspecified: Secondary | ICD-10-CM | POA: Insufficient documentation

## 2015-02-21 HISTORY — DX: Zoster without complications: B02.9

## 2015-02-21 MED ORDER — DICYCLOMINE HCL 20 MG PO TABS
20.0000 mg | ORAL_TABLET | Freq: Once | ORAL | Status: AC
  Administered 2015-02-22: 20 mg via ORAL
  Filled 2015-02-21: qty 1

## 2015-02-21 MED ORDER — ONDANSETRON HCL 4 MG/2ML IJ SOLN
4.0000 mg | Freq: Once | INTRAMUSCULAR | Status: AC
  Administered 2015-02-22: 4 mg via INTRAVENOUS
  Filled 2015-02-21: qty 2

## 2015-02-21 MED ORDER — RANITIDINE HCL 150 MG/10ML PO SYRP
300.0000 mg | ORAL_SOLUTION | Freq: Once | ORAL | Status: AC
  Administered 2015-02-22: 300 mg via ORAL
  Filled 2015-02-21: qty 20

## 2015-02-21 MED ORDER — ALUM & MAG HYDROXIDE-SIMETH 200-200-20 MG/5ML PO SUSP
30.0000 mL | Freq: Once | ORAL | Status: AC
  Administered 2015-02-22: 30 mL via ORAL
  Filled 2015-02-21: qty 30

## 2015-02-21 NOTE — ED Notes (Signed)
Bed: WA24 Expected date:  Expected time:  Means of arrival:  Comments: EMS-N/V 

## 2015-02-21 NOTE — ED Provider Notes (Signed)
CSN: [id number]     Arrival date & time 02/21/15  2228 History   This chart was scribed for Tomasita Crumble, MD by Octavia Heir, ED Scribe. This patient was seen in room WA12/WA12 and the patient's care was started at 11:29 PM.     Chief Complaint  Patient presents with  . Nausea  . Emesis  . Herpes Zoster      The history is provided by the patient. No language interpreter was used.   HPI Comments: Stephen Odom is a 39 y.o. male who presents to the Emergency Department complaining of constant, gradual worsening nausea and vomiting onset 3 days ago. He has associated abdominal pain and diarrhea. Pt was dx with shingles and was prescribed valacyclovir. He notes having these symptoms the same day after taking the first dosage of medication. Pt has not been eating or drinking as much as he usually does.  Pt denies chest pain and shortness of breath.   Past Medical History  Diagnosis Date  . Shingles    History reviewed. No pertinent past surgical history. Family History  Problem Relation Age of Onset  . Diabetes Mother   . Hypertension Mother   . Diabetes Maternal Grandmother    History  Substance Use Topics  . Smoking status: Light Tobacco Smoker -- 0.30 packs/day    Types: Cigarettes  . Smokeless tobacco: Not on file  . Alcohol Use: 0.5 oz/week    1 Standard drinks or equivalent per week    Review of Systems  A complete 10 system review of systems was obtained and all systems are negative except as noted in the HPI and PMH.    Allergies  Review of patient's allergies indicates no known allergies.  Home Medications   Prior to Admission medications   Medication Sig Start Date End Date Taking? Authorizing Provider  acyclovir (ZOVIRAX) 400 MG tablet Take 2 tablets (800 mg total) by mouth 5 (five) times daily. 02/18/15  Yes Everlene Farrier, PA-C  ibuprofen (ADVIL,MOTRIN) 200 MG tablet Take 400 mg by mouth every 6 (six) hours as needed for headache, mild pain or moderate pain.    Yes Historical Provider, MD  cetirizine HCl (ZYRTEC) 5 MG/5ML SYRP Take 5 mLs (5 mg total) by mouth daily. Patient not taking: Reported on 02/18/2015 08/14/13   Richarda Overlie, MD  cyclobenzaprine (FLEXERIL) 10 MG tablet Take 1 tablet (10 mg total) by mouth 2 (two) times daily as needed for muscle spasms. Patient not taking: Reported on 02/18/2015 02/09/15   Elpidio Anis, PA-C  guaifenesin (ROBITUSSIN) 100 MG/5ML syrup Take 5-10 mLs (100-200 mg total) by mouth every 4 (four) hours as needed for cough. Patient not taking: Reported on 02/18/2015 02/09/15   Elpidio Anis, PA-C  HYDROcodone-acetaminophen (NORCO/VICODIN) 5-325 MG per tablet Take 1 tablet by mouth every 4 (four) hours as needed. 02/18/15   Everlene Farrier, PA-C  ibuprofen (ADVIL,MOTRIN) 800 MG tablet Take 1 tablet (800 mg total) by mouth 3 (three) times daily. Patient not taking: Reported on 02/18/2015 02/09/15   Elpidio Anis, PA-C  naproxen (NAPROSYN) 250 MG tablet Take 1 tablet (250 mg total) by mouth 2 (two) times daily with a meal. 02/18/15   Everlene Farrier, PA-C   Triage vitals: BP 133/90 mmHg  Pulse 73  Temp(Src) 98.5 F (36.9 C) (Oral)  Resp 19  SpO2 100% Physical Exam  Constitutional: He is oriented to person, place, and time. Vital signs are normal. He appears well-developed and well-nourished.  Non-toxic appearance. He does not appear  ill. No distress.  HENT:  Head: Normocephalic and atraumatic.  Nose: Nose normal.  Mouth/Throat: Oropharynx is clear and moist. No oropharyngeal exudate.  Eyes: Conjunctivae and EOM are normal. Pupils are equal, round, and reactive to light. No scleral icterus.  Neck: Normal range of motion. Neck supple. No tracheal deviation, no edema, no erythema and normal range of motion present. No thyroid mass and no thyromegaly present.  Cardiovascular: Normal rate, regular rhythm, S1 normal, S2 normal, normal heart sounds, intact distal pulses and normal pulses.  Exam reveals no gallop and no friction rub.    No murmur heard. Pulses:      Radial pulses are 2+ on the right side, and 2+ on the left side.       Dorsalis pedis pulses are 2+ on the right side, and 2+ on the left side.  Pulmonary/Chest: Effort normal and breath sounds normal. No respiratory distress. He has no wheezes. He has no rhonchi. He has no rales.  Abdominal: Soft. Normal appearance and bowel sounds are normal. He exhibits no distension, no ascites and no mass. There is no hepatosplenomegaly. There is no tenderness. There is no rebound, no guarding and no CVA tenderness.  Musculoskeletal: Normal range of motion. He exhibits no edema or tenderness.  Lymphadenopathy:    He has no cervical adenopathy.  Neurological: He is alert and oriented to person, place, and time. He has normal strength. No cranial nerve deficit or sensory deficit.  Skin: Skin is warm, dry and intact. No petechiae and no rash noted. He is not diaphoretic. No erythema. No pallor.  Grouped blisters on erythematous base on the right flank extending to the abdomen at the t10-t12 dermatome  Psychiatric: He has a normal mood and affect. His behavior is normal. Judgment normal.  Nursing note and vitals reviewed.   ED Course  Procedures  DIAGNOSTIC STUDIES: Oxygen Saturation is 100% on RA, normal by my interpretation.  COORDINATION OF CARE:  11:32 PM Discussed treatment plan which includes eat with medication, nausea medication with pt at bedside and pt agreed to plan.  Labs Review Labs Reviewed  I-STAT CHEM 8, ED    Imaging Review No results found.   EKG Interpretation None      MDM   Final diagnoses:  None    Patient presents emergency department for nausea and vomiting after beginning Valtrex for shingles. Patient has been taking his medication for the last 3 days. He has been taking it on an empty stomach as well. He denies any changes in his urine or bowel movements. He's had no fevers.  Patient was given Zofran, Bentyl, ranitidine, maalox, in  the emergency department.  He was encouraged to continue Valtrex as he is HIV positive. This was stressed to him. Primary care follow-up was also advised for the next 3 days. He otherwise appears comfortable in no acute distress. His vital signs remain within his normal limits and he is safe for discharge.  I personally performed the services described in this documentation, which was scribed in my presence. The recorded information has been reviewed and is accurate.   Tomasita Crumble, MD 02/22/15 [phone]

## 2015-02-21 NOTE — ED Notes (Signed)
MD at bedside. 

## 2015-02-21 NOTE — ED Notes (Addendum)
Per EMS , pt. Is from home with complaint of nausea and vomiting after being on Acyclovir x3 days , pt. Has shingles, alert and oriented x3. Denies of SOB nor chest pain.received of NS and  IV Zofran on board EMS.

## 2015-02-22 MED ORDER — ONDANSETRON 4 MG PO TBDP: 4.0000 mg | ORAL_TABLET | Freq: Three times a day (TID) | ORAL | Status: DC | PRN

## 2015-02-22 NOTE — Discharge Instructions (Signed)
Nausea and Vomiting [name], It is important for you to continue valtrex for shingles.  Take zofran as needed for nausea and take medications on a full stomach.  See your primary care physician within 3 days for close follow-up. If symptoms worsen come back to the emergency department immediately. Thank you. Nausea means you feel sick to your stomach. Throwing up (vomiting) is a reflex where stomach contents come out of your mouth. HOME CARE   Take medicine as told by your doctor.  Do not force yourself to eat. However, you do need to drink fluids.  If you feel like eating, eat a normal diet as told by your doctor.  Eat rice, wheat, potatoes, bread, lean meats, yogurt, fruits, and vegetables.  Avoid high-fat foods.  Drink enough fluids to keep your pee (urine) clear or pale yellow.  Ask your doctor how to replace body fluid losses (rehydrate). Signs of body fluid loss (dehydration) include:  Feeling very thirsty.  Dry lips and mouth.  Feeling dizzy.  Dark pee.  Peeing less than normal.  Feeling confused.  Fast breathing or heart rate. GET HELP RIGHT AWAY IF:   You have blood in your throw up.  You have black or bloody poop (stool).  You have a bad headache or stiff neck.  You feel confused.  You have bad belly (abdominal) pain.  You have chest pain or trouble breathing.  You do not pee at least once every 8 hours.  You have cold, clammy skin.  You keep throwing up after 24 to 48 hours.  You have a fever. MAKE SURE YOU:   Understand these instructions.  Will watch your condition.  Will get help right away if you are not doing well or get worse. Document Released: 12/29/2007 Document Revised: 10/04/2011 Document Reviewed: 12/11/2010 [facility] Patient Information 2015 [place], [place]. This information is not intended to replace advice given to you by your health care provider. Make sure you discuss any questions you have with your health care  provider. Shingles Shingles is caused by the same virus that causes chickenpox. The first feelings may be pain or tingling. A rash will follow in a couple days. The rash may occur on any area of the body. Long-lasting pain is more likely in an elderly person. It can last months to years. There are medicines that can help prevent pain if you start taking them early. HOME CARE   Take cool baths or place cool cloths on the rash as told by your doctor.  Take medicine only as told by your doctor.  Rest as told by your doctor.  Keep your rash clean with mild soap and cool water or as told by your doctor.  Do not scratch your rash. You may use calamine lotion to relieve itchy skin as told by your doctor.  Keep your rash covered with a loose bandage (dressing).  Avoid touching:  Babies.  Pregnant women.  Children with inflamed skin (eczema).  People who have gotten organ transplants.  People with chronic illnesses, such as leukemia or AIDS.  Wear loose-fitting clothing.  If the rash is on the face, you may need to see a specialist. Keep all appointments. Shingles must be kept away from the eyes, if possible.  Keep all follow-up visits as told by your doctor. GET HELP RIGHT AWAY IF:   You have any pain on the face or eye.  You lose feeling on one side of your face.  You have ear pain or ringing  in your ear.  You cannot taste as well.  Your medicines do not help the pain.  Your redness or puffiness (swelling) spreads.  You feel like you are getting worse.  You have a fever. MAKE SURE YOU:   Understand these instructions.  Will watch your condition.  Will get help right away if you are not doing well or get worse. Document Released: 12/29/2007 Document Revised: 11/26/2013 Document Reviewed: 12/29/2007 [facility] Patient Information 2015 [place], [place]. This information is not intended to replace advice given to you by your health care provider. Make sure you discuss any  questions you have with your health care provider.

## 2015-02-28 ENCOUNTER — Ambulatory Visit: Payer: Self-pay | Attending: Family Medicine | Admitting: Family Medicine

## 2015-02-28 ENCOUNTER — Encounter: Payer: Self-pay | Admitting: Family Medicine

## 2015-02-28 VITALS — BP 150/90 | HR 70 | Temp 99.1°F | Resp 16 | Ht 66.0 in | Wt 134.0 lb

## 2015-02-28 DIAGNOSIS — B20 Human immunodeficiency virus [HIV] disease: Secondary | ICD-10-CM | POA: Insufficient documentation

## 2015-02-28 DIAGNOSIS — Z87891 Personal history of nicotine dependence: Secondary | ICD-10-CM | POA: Insufficient documentation

## 2015-02-28 DIAGNOSIS — Z21 Asymptomatic human immunodeficiency virus [HIV] infection status: Secondary | ICD-10-CM

## 2015-02-28 DIAGNOSIS — Z23 Encounter for immunization: Secondary | ICD-10-CM

## 2015-02-28 DIAGNOSIS — Z113 Encounter for screening for infections with a predominantly sexual mode of transmission: Secondary | ICD-10-CM

## 2015-02-28 DIAGNOSIS — B029 Zoster without complications: Secondary | ICD-10-CM | POA: Insufficient documentation

## 2015-02-28 LAB — CBC
HCT: 41.9 % (ref 39.0–52.0)
Hemoglobin: 14.2 g/dL (ref 13.0–17.0)
MCH: 28.6 pg (ref 26.0–34.0)
MCHC: 33.9 g/dL (ref 30.0–36.0)
MCV: 84.5 fL (ref 78.0–100.0)
MPV: 7.7 fL — AB (ref 8.6–12.4)
Platelets: 355 10*3/uL (ref 150–400)
RBC: 4.96 MIL/uL (ref 4.22–5.81)
RDW: 12.9 % (ref 11.5–15.5)
WBC: 4 10*3/uL (ref 4.0–10.5)

## 2015-02-28 LAB — COMPLETE METABOLIC PANEL WITH GFR
ALT: 15 U/L (ref 9–46)
AST: 22 U/L (ref 10–40)
Albumin: 3.7 g/dL (ref 3.6–5.1)
Alkaline Phosphatase: 76 U/L (ref 40–115)
BUN: 20 mg/dL (ref 7–25)
CALCIUM: 9.5 mg/dL (ref 8.6–10.3)
CO2: 29 mmol/L (ref 20–31)
Chloride: 102 mmol/L (ref 98–110)
Creat: 0.77 mg/dL (ref 0.60–1.35)
GFR, Est Non African American: >89 mL/min (ref >=60)
Glucose, Bld: 79 mg/dL (ref 65–99)
Potassium: 5.4 mmol/L — ABNORMAL HIGH (ref 3.5–5.3)
Sodium: 138 mmol/L (ref 135–146)
Total Bilirubin: 0.3 mg/dL (ref 0.2–1.2)
Total Protein: 8 g/dL (ref 6.1–8.1)

## 2015-02-28 LAB — LIPID PANEL
CHOLESTEROL: 140 mg/dL (ref 125–200)
HDL: 52 mg/dL (ref >=40)
LDL Cholesterol: 78 mg/dL (ref <130)
Total CHOL/HDL Ratio: 2.7 Ratio (ref <=5.0)
Triglycerides: 50 mg/dL (ref <150)
VLDL: 10 mg/dL (ref <30)

## 2015-02-28 LAB — ACUTE HEP PANEL AND HEP B SURFACE AB
HCV AB: NEGATIVE
HEP A IGM: NONREACTIVE
HEP B S AG: NEGATIVE
Hep B C IgM: NONREACTIVE
Hep B S Ab: NEGATIVE

## 2015-02-28 MED ORDER — IBUPROFEN 600 MG PO TABS: 600.0000 mg | ORAL_TABLET | Freq: Three times a day (TID) | ORAL | Status: DC | PRN

## 2015-02-28 MED ORDER — GABAPENTIN 300 MG PO CAPS: 300.0000 mg | ORAL_CAPSULE | Freq: Three times a day (TID) | ORAL | Status: DC

## 2015-02-28 MED ORDER — ACYCLOVIR 400 MG PO TABS: 400.0000 mg | ORAL_TABLET | Freq: Two times a day (BID) | ORAL | Status: DC

## 2015-02-28 NOTE — Progress Notes (Signed)
   Subjective:    Patient ID: Stephen Odom, male    DOB: 09/04/1975, 39 y.o.   MRN: [id number] CC: f/u HIV, shingles, meet PCP  HPI 39 yo M presents for f/u visit  1. HIV: untreated. Dx in 2015. Did not receive calls to establish with ID clinic. Has sex with men but no partners since diagnosis. Has fevers during shingles outbreak. Otherwise denies fever, chills, weight loss, cough SOB. Would like treatment for HIV and screening for other STDs.  2. Shingles: R flank. Treated with 5 days course of acyclovir. Has 2 months of itching prior to rash. No has burning and pain sensation in R flank that is improving. Reports similar outbreak on R thigh a few years ago.   History  Substance Use Topics  . Smoking status: Former Smoker -- 0.30 packs/day    Types: Cigarettes    Quit date: 01/28/2015  . Smokeless tobacco: Never Used  . Alcohol Use: No     Comment: previously rare     Review of Systems  Constitutional: Negative for fever, chills, fatigue and unexpected weight change.  Eyes: Negative for visual disturbance.  Respiratory: Negative for cough and shortness of breath.   Cardiovascular: Negative for chest pain, palpitations and leg swelling.  Gastrointestinal: Negative for nausea, vomiting, abdominal pain, diarrhea, constipation and blood in stool.  Endocrine: Negative for polydipsia, polyphagia and polyuria.  Musculoskeletal: Negative for myalgias, back pain, arthralgias, gait problem and neck pain.  Skin: Positive for rash.  Allergic/Immunologic: Positive for immunocompromised state.  Hematological: Negative for adenopathy. Does not bruise/bleed easily.  Psychiatric/Behavioral: Negative for suicidal ideas, sleep disturbance and dysphoric mood. The patient is not nervous/anxious.       Objective:   Physical Exam  Constitutional: He appears well-developed and well-nourished. No distress.  HENT:  Head: Normocephalic and atraumatic.  Neck: Normal range of motion. Neck supple.    Cardiovascular: Normal rate, regular rhythm, normal heart sounds and intact distal pulses.   Pulmonary/Chest: Effort normal and breath sounds normal.  Musculoskeletal: He exhibits no edema.  Lymphadenopathy:    He has no cervical adenopathy.    He has no axillary adenopathy.  Neurological: He is alert.  Skin: Skin is warm and dry. Rash noted. No erythema.     Psychiatric: He has a normal mood and affect.  BP 150/90 mmHg  Pulse 70  Temp(Src) 99.1 F (37.3 C) (Oral)  Resp 16  Ht  (1.676 m)  Wt 134 lb (60.782 kg)  BMI 21.64 kg/m2  SpO2 100%     Assessment & Plan:

## 2015-02-28 NOTE — Assessment & Plan Note (Signed)
Shingles with post-herpetic neuralgia Gabapentin for pain start at night only for first week, then twice daily, then up to three times daily if needed  ibuprofen for pain Acyclovir twice daily for suppression of virus to prevent outbreaks

## 2015-02-28 NOTE — Assessment & Plan Note (Signed)
Std screening-done today

## 2015-02-28 NOTE — Patient Instructions (Addendum)
Mr. Gignac,  Thank you for coming in today.  1. HIV positive- ID referral today  2. Std screening-done today  3. Shingles with post-herpetic neuralgia Gabapentin for pain start at night only for first week, then twice daily, then up to three times daily if needed  ibuprofen for pain Acyclovir twice daily for suppression of virus to prevent outbreaks   4. Healthcare maintenance:  PCV13 today  Td due Flu shot due    F/u in 8 weeks for flu shot  F/u with me in 3 months   Dr. Armen Pickup

## 2015-02-28 NOTE — Progress Notes (Signed)
Establish Care Hx HIV, shingles

## 2015-02-28 NOTE — Assessment & Plan Note (Signed)
HIV positive- ID referral today

## 2015-03-01 LAB — RPR

## 2015-03-03 LAB — URINE CYTOLOGY ANCILLARY ONLY
CHLAMYDIA, DNA PROBE: NEGATIVE
Neisseria Gonorrhea: NEGATIVE
Trichomonas: NEGATIVE

## 2015-03-19 ENCOUNTER — Telehealth: Payer: Self-pay

## 2015-03-19 NOTE — Telephone Encounter (Signed)
Patient contacted regarding new intake appointment. Date and time given. Information given regarding documents needed to qualify for financial eligibility.  Tammy K King, RN  

## 2015-03-19 NOTE — Telephone Encounter (Signed)
Referral received from [facility] for HIV treatment.  Message left on machine to call for appointment   Laurell Josephs, RN

## 2015-03-25 ENCOUNTER — Ambulatory Visit: Payer: Self-pay

## 2015-03-25 DIAGNOSIS — Z21 Asymptomatic human immunodeficiency virus [HIV] infection status: Secondary | ICD-10-CM

## 2015-03-25 DIAGNOSIS — B20 Human immunodeficiency virus [HIV] disease: Secondary | ICD-10-CM

## 2015-03-25 LAB — CBC WITH DIFFERENTIAL/PLATELET
BASOS ABS: 0 10*3/uL (ref 0.0–0.1)
Basophils Relative: 0 % (ref 0–1)
EOS ABS: 0.1 10*3/uL (ref 0.0–0.7)
Eosinophils Relative: 5 % (ref 0–5)
HCT: 39 % (ref 39.0–52.0)
Hemoglobin: 12.9 g/dL — ABNORMAL LOW (ref 13.0–17.0)
LYMPHS ABS: 1 10*3/uL (ref 0.7–4.0)
Lymphocytes Relative: 35 % (ref 12–46)
MCH: 28.1 pg (ref 26.0–34.0)
MCHC: 33.1 g/dL (ref 30.0–36.0)
MCV: 85 fL (ref 78.0–100.0)
MPV: 7.9 fL — ABNORMAL LOW (ref 8.6–12.4)
Monocytes Absolute: 0.4 10*3/uL (ref 0.1–1.0)
Monocytes Relative: 14 % — ABNORMAL HIGH (ref 3–12)
Neutro Abs: 1.3 10*3/uL — ABNORMAL LOW (ref 1.7–7.7)
Neutrophils Relative %: 46 % (ref 43–77)
Platelets: 312 10*3/uL (ref 150–400)
RBC: 4.59 MIL/uL (ref 4.22–5.81)
RDW: 13 % (ref 11.5–15.5)
WBC: 2.9 10*3/uL — ABNORMAL LOW (ref 4.0–10.5)

## 2015-03-25 LAB — COMPLETE METABOLIC PANEL WITH GFR
ALT: 11 U/L (ref 9–46)
AST: 17 U/L (ref 10–40)
Albumin: 4.1 g/dL (ref 3.6–5.1)
Alkaline Phosphatase: 78 U/L (ref 40–115)
BILIRUBIN TOTAL: 0.4 mg/dL (ref 0.2–1.2)
BUN: 13 mg/dL (ref 7–25)
CO2: 27 mmol/L (ref 20–31)
Calcium: 9.1 mg/dL (ref 8.6–10.3)
Chloride: 103 mmol/L (ref 98–110)
Creat: 0.77 mg/dL (ref 0.60–1.35)
GFR, Est African American: >89 mL/min (ref >=60)
GFR, Est Non African American: >89 mL/min (ref >=60)
GLUCOSE: 89 mg/dL (ref 65–99)
Potassium: 4 mmol/L (ref 3.5–5.3)
Sodium: 139 mmol/L (ref 135–146)
TOTAL PROTEIN: 7.9 g/dL (ref 6.1–8.1)

## 2015-03-26 LAB — T-HELPER CELL (CD4) - (RCID CLINIC ONLY)
CD4 % Helper T Cell: 19 % — ABNORMAL LOW (ref 33–55)
CD4 T CELL ABS: 200 /uL — AB (ref 400–2700)

## 2015-03-26 LAB — HIV-1 RNA ULTRAQUANT REFLEX TO GENTYP+
HIV 1 RNA Quant: 21328 copies/mL — ABNORMAL HIGH (ref <20)
HIV-1 RNA QUANT, LOG: 4.33 {Log} — AB (ref <1.30)

## 2015-03-27 LAB — QUANTIFERON TB GOLD ASSAY (BLOOD)
Interferon Gamma Release Assay: NEGATIVE
MITOGEN VALUE: 2.84 [IU]/mL
QUANTIFERON NIL VALUE: 0.05 [IU]/mL
QUANTIFERON TB AG MINUS NIL: 0 [IU]/mL
TB Ag value: 0.05 IU/mL

## 2015-03-28 MED ORDER — SULFAMETHOXAZOLE-TRIMETHOPRIM 800-160 MG PO TABS: 1.0000 | ORAL_TABLET | Freq: Once | ORAL | Status: DC

## 2015-03-28 NOTE — Progress Notes (Signed)
Patient was diagnosed with HIV in January, 2015. He needed some time to think about diagnosis and did not seek treatment until he started having symptoms of shingles in July,  2016.  Reports a negative test in 2005 while incarcerated. States a long painful bout with shingles and still has occasion pain in lower right back  and upper chest area.  He had an allergic reaction to Zovirax and shingles pain seem to worsen.  He has not been sexually active since HIV diagnosis and prefers males as sexual partners.   He is eager to start treatment and will need assistance with dental and housing.  No tattoos. Bilateral ear piercings present.  No medical records to request .  Vaccines up to date.   Laurell Josephs, RN

## 2015-03-28 NOTE — Progress Notes (Signed)
Due to recent CD-4 of 200 patient will need prophylactic  treatment for AIDS. Bactrim DS daily per Dr Drue Second .

## 2015-04-03 ENCOUNTER — Telehealth: Payer: Self-pay | Admitting: *Deleted

## 2015-04-03 LAB — HIV-1 GENOTYPR PLUS

## 2015-04-03 LAB — HLA B*5701: HLA-B*5701 w/rflx HLA-B High: NEGATIVE

## 2015-04-03 NOTE — Telephone Encounter (Signed)
-----   Message from Dessa Phi, MD sent at 03/03/2015  3:43 PM EDT ----- Negative Gc/chlam and trich

## 2015-04-03 NOTE — Telephone Encounter (Signed)
Date of birth verified by pt Results given to pt  Pt verbalized understanding

## 2015-04-09 ENCOUNTER — Encounter: Payer: Self-pay | Admitting: Internal Medicine

## 2015-04-09 ENCOUNTER — Ambulatory Visit (INDEPENDENT_AMBULATORY_CARE_PROVIDER_SITE_OTHER): Payer: Self-pay | Admitting: Internal Medicine

## 2015-04-09 ENCOUNTER — Other Ambulatory Visit: Payer: Self-pay | Admitting: *Deleted

## 2015-04-09 ENCOUNTER — Ambulatory Visit: Payer: Self-pay | Admitting: Internal Medicine

## 2015-04-09 VITALS — BP 127/74 | HR 92 | Temp 98.2°F | Ht 66.0 in | Wt 138.0 lb

## 2015-04-09 DIAGNOSIS — Z21 Asymptomatic human immunodeficiency virus [HIV] infection status: Secondary | ICD-10-CM

## 2015-04-09 DIAGNOSIS — Z23 Encounter for immunization: Secondary | ICD-10-CM

## 2015-04-09 MED ORDER — SULFAMETHOXAZOLE-TRIMETHOPRIM 800-160 MG PO TABS: 1.0000 | ORAL_TABLET | Freq: Once | ORAL | Status: DC

## 2015-04-09 MED ORDER — ELVITEG-COBIC-EMTRICIT-TENOFAF 150-150-200-10 MG PO TABS: 1.0000 | ORAL_TABLET | Freq: Every day | ORAL | Status: DC

## 2015-04-09 NOTE — Progress Notes (Signed)
Patient ID: Stephen Odom, male    DOB: February 10, 1976, 39 y.o.   MRN: [id number]  HPI:   Here to establish care for HIV.  Diagnosed in January 2015 but did not look for treatment until recently with bad shingles.  Endorses MSM.  No weight loss, no diarrhea.  Interested in treatment.  Does feel he will be able to take medications daily.    Past Medical History  Diagnosis Date  . Shingles   . HIV infection Dx 2015    Prior to Admission medications   Medication Sig Start Date End Date Taking? Authorizing Provider  gabapentin (NEURONTIN) 300 MG capsule Take 1 capsule (300 mg total) by mouth 3 (three) times daily. 02/28/15  Yes Josalyn Funches, MD  ibuprofen (ADVIL,MOTRIN) 600 MG tablet Take 1 tablet (600 mg total) by mouth every 8 (eight) hours as needed. 02/28/15  Yes Josalyn Funches, MD  sulfamethoxazole-trimethoprim (BACTRIM DS,SEPTRA DS) 800-160 MG per tablet Take 1 tablet by mouth once. 03/28/15  Yes Judyann Munson, MD    Allergies  Allergen Reactions  . Zovirax [Acyclovir] Nausea And Vomiting    Social History  Substance Use Topics  . Smoking status: Former Smoker -- 0.30 packs/day for 21 years    Types: Cigarettes    Quit date: 01/28/2015  . Smokeless tobacco: Never Used  . Alcohol Use: 0.6 oz/week    1 Standard drinks or equivalent per week     Comment: previously rare     Family History  Problem Relation Age of Onset  . Diabetes Mother   . Hypertension Mother   . Diabetes Maternal Grandmother   . Diabetes Father   . Hypertension Father   . Cancer Maternal Uncle     lung, stomach     Review of Systems A comprehensive review of systems was negative except for: Constitutional: positive for some dizziness after flu shot   Filed Vitals:   04/09/15 1336  BP: 127/74  Pulse: 92  Temp: 98.2 F (36.8 C)   GEN:in no apparent distress and alert HEENT: anicteric CARD:Cor RRR and No murmurs LUNG:clear NW:GNFAO sounds are normal, liver is not enlarged, spleen is not  enlarged ZHY:QMVHQIONGE pulses normal, no pedal edema, no clubbing or cyanosis SKIN: negative for - jaundice, spider hemangioma, telangiectasia, palmar erythema, ecchymosis and atrophy; + eosinophilic foliculitis MS: no joint swelling; thin extremities  Lab Results  Component Value Date   HIV1RNAQUANT 21328* 03/25/2015   No components found for: HIV1GENOTYPRPLUS No components found for: THELPERCELL  Assessment: new HIV patient.  Discussed with patient treatment options and side effects, benefits of treatment, long term outcomes.  Discussed needing to use condoms, partner disclosure, necessary vaccines, blood monitoring.  All questions answered.    Plan: 1) Will start Genvoya through drug assitance and [facility]. 2) labs in 3-4 weeks on treatment 3) follow up after labs 4) discussed compliance

## 2015-04-16 ENCOUNTER — Telehealth: Payer: Self-pay | Admitting: *Deleted

## 2015-04-16 NOTE — Telephone Encounter (Signed)
Patient's [facility] medication is ready for pick up. Notified patient, he will come 9/22. Sheppard Penton [id number] discard after 04/13/16 Andree Coss, RN

## 2015-05-08 ENCOUNTER — Other Ambulatory Visit: Payer: Self-pay

## 2015-05-08 DIAGNOSIS — Z21 Asymptomatic human immunodeficiency virus [HIV] infection status: Secondary | ICD-10-CM

## 2015-05-08 LAB — COMPLETE METABOLIC PANEL WITH GFR
ALT: 9 U/L (ref 9–46)
AST: 23 U/L (ref 10–40)
Albumin: 4.1 g/dL (ref 3.6–5.1)
Alkaline Phosphatase: 50 U/L (ref 40–115)
BUN: 15 mg/dL (ref 7–25)
CHLORIDE: 103 mmol/L (ref 98–110)
CO2: 25 mmol/L (ref 20–31)
CREATININE: 0.93 mg/dL (ref 0.60–1.35)
Calcium: 9.3 mg/dL (ref 8.6–10.3)
GFR, Est African American: >89 mL/min (ref >=60)
GFR, Est Non African American: >89 mL/min (ref >=60)
GLUCOSE: 75 mg/dL (ref 65–99)
POTASSIUM: 5.5 mmol/L — AB (ref 3.5–5.3)
SODIUM: 136 mmol/L (ref 135–146)
Total Bilirubin: 0.5 mg/dL (ref 0.2–1.2)
Total Protein: 7.9 g/dL (ref 6.1–8.1)

## 2015-05-09 LAB — CBC WITH DIFFERENTIAL/PLATELET
Basophils Absolute: 0.1 10*3/uL (ref 0.0–0.1)
Basophils Relative: 2 % — ABNORMAL HIGH (ref 0–1)
Eosinophils Absolute: 0.1 10*3/uL (ref 0.0–0.7)
Eosinophils Relative: 5 % (ref 0–5)
HEMATOCRIT: 40.2 % (ref 39.0–52.0)
HEMOGLOBIN: 13.7 g/dL (ref 13.0–17.0)
LYMPHS ABS: 1.3 10*3/uL (ref 0.7–4.0)
Lymphocytes Relative: 47 % — ABNORMAL HIGH (ref 12–46)
MCH: 28.2 pg (ref 26.0–34.0)
MCHC: 34.1 g/dL (ref 30.0–36.0)
MCV: 82.9 fL (ref 78.0–100.0)
MONOS PCT: 17 % — AB (ref 3–12)
MPV: 7.8 fL — ABNORMAL LOW (ref 8.6–12.4)
Monocytes Absolute: 0.5 10*3/uL (ref 0.1–1.0)
NEUTROS ABS: 0.8 10*3/uL — AB (ref 1.7–7.7)
Neutrophils Relative %: 29 % — ABNORMAL LOW (ref 43–77)
Platelets: 271 10*3/uL (ref 150–400)
RBC: 4.85 MIL/uL (ref 4.22–5.81)
RDW: 14.6 % (ref 11.5–15.5)
WBC: 2.7 10*3/uL — ABNORMAL LOW (ref 4.0–10.5)

## 2015-05-09 LAB — T-HELPER CELL (CD4) - (RCID CLINIC ONLY)
CD4 % Helper T Cell: 21 % — ABNORMAL LOW (ref 33–55)
CD4 T Cell Abs: 290 /uL — ABNORMAL LOW (ref 400–2700)

## 2015-05-09 LAB — HIV-1 RNA QUANT-NO REFLEX-BLD
HIV 1 RNA Quant: <20 copies/mL (ref <20)
HIV-1 RNA Quant, Log: <1.3 {Log} (ref <1.30)

## 2015-05-22 ENCOUNTER — Ambulatory Visit: Payer: Self-pay | Admitting: Internal Medicine

## 2015-06-02 ENCOUNTER — Other Ambulatory Visit: Payer: Self-pay

## 2015-06-16 ENCOUNTER — Ambulatory Visit: Payer: Self-pay | Admitting: Internal Medicine

## 2015-07-07 ENCOUNTER — Other Ambulatory Visit: Payer: Self-pay

## 2015-07-29 ENCOUNTER — Ambulatory Visit: Payer: Self-pay | Admitting: Internal Medicine

## 2015-07-31 ENCOUNTER — Ambulatory Visit: Payer: Self-pay | Admitting: Internal Medicine

## 2015-10-20 ENCOUNTER — Other Ambulatory Visit: Payer: Self-pay | Admitting: Internal Medicine

## 2015-10-20 ENCOUNTER — Other Ambulatory Visit (INDEPENDENT_AMBULATORY_CARE_PROVIDER_SITE_OTHER): Payer: Self-pay

## 2015-10-20 ENCOUNTER — Other Ambulatory Visit: Payer: Self-pay

## 2015-10-20 DIAGNOSIS — B20 Human immunodeficiency virus [HIV] disease: Secondary | ICD-10-CM

## 2015-10-20 LAB — CBC WITH DIFFERENTIAL/PLATELET
BASOS ABS: 0 10*3/uL (ref 0.0–0.1)
Basophils Relative: 1 % (ref 0–1)
EOS PCT: 1 % (ref 0–5)
Eosinophils Absolute: 0 10*3/uL (ref 0.0–0.7)
HEMATOCRIT: 40.4 % (ref 39.0–52.0)
HEMOGLOBIN: 13.5 g/dL (ref 13.0–17.0)
LYMPHS ABS: 1.4 10*3/uL (ref 0.7–4.0)
LYMPHS PCT: 37 % (ref 12–46)
MCH: 28.7 pg (ref 26.0–34.0)
MCHC: 33.4 g/dL (ref 30.0–36.0)
MCV: 85.8 fL (ref 78.0–100.0)
MPV: 8.1 fL — AB (ref 8.6–12.4)
Monocytes Absolute: 0.4 10*3/uL (ref 0.1–1.0)
Monocytes Relative: 10 % (ref 3–12)
NEUTROS ABS: 2 10*3/uL (ref 1.7–7.7)
NEUTROS PCT: 51 % (ref 43–77)
Platelets: 297 10*3/uL (ref 150–400)
RBC: 4.71 MIL/uL (ref 4.22–5.81)
RDW: 13.6 % (ref 11.5–15.5)
WBC: 3.9 10*3/uL — AB (ref 4.0–10.5)

## 2015-10-21 LAB — COMPLETE METABOLIC PANEL WITH GFR
ALK PHOS: 66 U/L (ref 40–115)
ALT: 8 U/L — AB (ref 9–46)
AST: 19 U/L (ref 10–40)
Albumin: 4.3 g/dL (ref 3.6–5.1)
BUN: 11 mg/dL (ref 7–25)
CO2: 27 mmol/L (ref 20–31)
Calcium: 9.4 mg/dL (ref 8.6–10.3)
Chloride: 106 mmol/L (ref 98–110)
Creat: 0.99 mg/dL (ref 0.60–1.35)
GFR, Est African American: >89 mL/min (ref >=60)
GLUCOSE: 78 mg/dL (ref 65–99)
POTASSIUM: 4.1 mmol/L (ref 3.5–5.3)
Sodium: 141 mmol/L (ref 135–146)
TOTAL PROTEIN: 7.9 g/dL (ref 6.1–8.1)
Total Bilirubin: 0.4 mg/dL (ref 0.2–1.2)

## 2015-10-21 LAB — HIV-1 RNA QUANT-NO REFLEX-BLD
HIV 1 RNA QUANT: 938 {copies}/mL — AB (ref <20)
HIV-1 RNA Quant, Log: 2.97 Log copies/mL — ABNORMAL HIGH (ref <1.30)

## 2015-10-21 LAB — T-HELPER CELL (CD4) - (RCID CLINIC ONLY)
CD4 % Helper T Cell: 26 % — ABNORMAL LOW (ref 33–55)
CD4 T Cell Abs: 400 /uL (ref 400–2700)

## 2015-10-22 ENCOUNTER — Telehealth: Payer: Self-pay | Admitting: *Deleted

## 2015-10-22 ENCOUNTER — Other Ambulatory Visit: Payer: Self-pay | Admitting: *Deleted

## 2015-10-22 NOTE — Telephone Encounter (Signed)
Called the lab and test added.

## 2015-10-22 NOTE — Telephone Encounter (Signed)
-----   Message from Gardiner Barefootobert W Comer, MD sent at 10/22/2015  7:36 AM EDT ----- He has detectable virus despite being on treatment for over 6 months.  Did he just restart it?  If not, he is becoming resistant and can try to add on a genotype and start prezista 800 mg daily with his genvoya.  Maybe have him come see Minh to flush it out. thanks

## 2015-10-23 NOTE — Telephone Encounter (Signed)
Called patient at Minh's request to help get him into clinic, pharmacy appointment.  Patient's phone is not accepting calls.  Emergency contact's phone number is disconnected. Andree CossHowell, Reneta Niehaus M, RN

## 2015-10-30 LAB — HIV-1 GENOTYPR PLUS

## 2015-11-06 ENCOUNTER — Ambulatory Visit: Payer: Self-pay | Admitting: Internal Medicine

## 2015-11-13 ENCOUNTER — Other Ambulatory Visit: Payer: Self-pay | Admitting: Internal Medicine

## 2015-12-01 ENCOUNTER — Encounter: Payer: Self-pay | Admitting: Internal Medicine

## 2015-12-11 ENCOUNTER — Ambulatory Visit: Payer: Self-pay | Admitting: Internal Medicine

## 2016-01-13 ENCOUNTER — Telehealth: Payer: Self-pay | Admitting: *Deleted

## 2016-01-13 NOTE — Telephone Encounter (Signed)
FYI

## 2016-01-13 NOTE — Telephone Encounter (Signed)
-----   Message from Mortons GapMinh Q Pham, [facility] sent at 01/13/2016  3:40 PM EDT ----- I was finally able to contact him to try to bring him in. He is working all week now so he can't do it right now. He stated that the reason for the increase in VL in late march was due to running out of meds and he didn't have any transportation to pick up. He has restarted on it and had only missed one dose since then. He has an appt with Dr. Orvan Falconerampbell in July and he knows to show up for it.   ----- Message -----    From: Gardiner Barefootobert W Comer, MD    Sent: 10/22/2015   7:36 AM      To: Minh Durene CalQ Pham, [facility], [facility] Triage Nurse Pool  He has detectable virus despite being on treatment for over 6 months.  Did he just restart it?  If not, he is becoming resistant and can try to add on a genotype and start prezista 800 mg daily with his genvoya.  Maybe have him come see Minh to flush it out. thanks

## 2016-01-16 ENCOUNTER — Other Ambulatory Visit: Payer: Self-pay | Admitting: Internal Medicine

## 2016-01-16 DIAGNOSIS — B20 Human immunodeficiency virus [HIV] disease: Secondary | ICD-10-CM

## 2016-02-10 ENCOUNTER — Ambulatory Visit (INDEPENDENT_AMBULATORY_CARE_PROVIDER_SITE_OTHER): Payer: Self-pay | Admitting: Internal Medicine

## 2016-02-10 ENCOUNTER — Encounter: Payer: Self-pay | Admitting: Internal Medicine

## 2016-02-10 DIAGNOSIS — B20 Human immunodeficiency virus [HIV] disease: Secondary | ICD-10-CM

## 2016-02-10 DIAGNOSIS — Z21 Asymptomatic human immunodeficiency virus [HIV] infection status: Secondary | ICD-10-CM

## 2016-02-10 NOTE — Progress Notes (Signed)
[facility]  Patient Active Problem List   Diagnosis Date Noted  . Screening for STD (sexually transmitted disease) 02/28/2015  . Shingles 02/28/2015  . HIV positive (HCC) 12/31/2013    Patient's Medications  New Prescriptions   No medications on file  Previous Medications   GENVOYA 150-150-200-10 MG TABS TABLET    TAKE 1 TABLET BY MOUTH EVERY DAY   IBUPROFEN (ADVIL,MOTRIN) 600 MG TABLET    Take 1 tablet (600 mg total) by mouth every 8 (eight) hours as needed.  Modified Medications   No medications on file  Discontinued Medications   GABAPENTIN (NEURONTIN) 300 MG CAPSULE    Take 1 capsule (300 mg total) by mouth 3 (three) times daily.   SULFAMETHOXAZOLE-TRIMETHOPRIM (BACTRIM DS,SEPTRA DS) 800-160 MG PER TABLET    Take 1 tablet by mouth once.    Subjective: Stephen Odom is in for his first visit since his HIV intake visit with Dr. Luciana Axe last September. After that visit he started on Genvoya. He has problems getting across town to pick up his medication at [facility] on [address]. This has caused him to miss medication. He also varies the time he takes his medication frequently. Some days he will take it in the morning and other days he will take it in the evening. He usually takes it on an empty stomach rather than with a meal. He tells me that when he drinks alcohol he does not take Genvoya because he is worried that it would be ineffective. He denies drinking to excess. He is not on any other medications currently.  Review of Systems: Review of Systems  Constitutional: Negative for weight loss and malaise/fatigue.       He tells me that he has more energy since starting Genvoya.    Past Medical History  Diagnosis Date  . Shingles   . HIV infection [facility]) Dx 2015    Social History  Substance Use Topics  . Smoking status: Current Some Day Smoker -- 0.30 packs/day for 21 years    Types: Cigarettes    Last Attempt to Quit:  01/28/2015  . Smokeless tobacco: Never Used  . Alcohol Use: 14.4 oz/week    24 Standard drinks or equivalent per week     Comment: weekends only    Family History  Problem Relation Age of Onset  . Diabetes Mother   . Hypertension Mother   . Diabetes Maternal Grandmother   . Diabetes Father   . Hypertension Father   . Cancer Maternal Uncle     lung, stomach     Allergies  Allergen Reactions  . Zovirax [Acyclovir] Nausea And Vomiting    Objective: Filed Vitals:   02/10/16 1546  BP: 148/87  Pulse: 61  Temp: 98.2 F (36.8 C)  TempSrc: Oral  Height:  (1.702 m)  Weight: 131 lb 12 oz (59.761 kg)   Body mass index is 20.63 kg/(m^2).  Physical Exam  Constitutional: He is oriented to person, place, and time.  He has alert, pleasant and in no distress.  Neurological: He is alert and oriented to person, place, and time.  Skin: No rash noted.  Psychiatric: Mood and affect normal.    Lab Results    Problem List Items Addressed This Visit      Unprioritized   HIV positive (HCC) (Chronic)    He has multiple difficulties understanding how to take his Genvoya correctly. I spoke with him about this  and also had him meet with Tennis Mustassie Stewart, our infectious disease pharmacist, to go over adherence tips. We will also see if we can make it easier for [facility]. His last viral load was up. He tells me that this is when he had been off his medication for 1 week. I will repeat lab work today and see him back in 6 weeks.      Relevant Orders   T-helper cell (CD4)- ([facility] clinic only)   HIV 1 RNA quant-no reflex-bld   HIV-1 genotypr plus   HIV-1 Integrase Genotype       Stephen AstersJohn Karmina Zufall, MD [facility] [phone] pager   [phone] cell 02/10/2016, 4:20 PM

## 2016-02-10 NOTE — Assessment & Plan Note (Signed)
He has multiple difficulties understanding how to take his Genvoya correctly. I spoke with him about this and also had him meet with Tennis Mustassie Stewart, our infectious disease pharmacist, to go over adherence tips. We will also see if we can make it easier for him [facility]. His last viral load was up. He tells me that this is when he had been off his medication for 1 week. I will repeat lab work today and see him back in 6 weeks.

## 2016-02-10 NOTE — Progress Notes (Signed)
HPI: Stephen Odom is a 40 y.o. male who presents to the clinic to see Dr. Orvan Falconerampbell today for the first time since last September.  He has been taking Genvoya sporadically.  He usually takes it whenever he remembers - morning or night, and usually does not take it when he drinks alcohol at night.  [facility]  Allergies: Allergies  Allergen Reactions  . Zovirax [Acyclovir] Nausea And Vomiting    Vitals: Temp: 98.2 F (36.8 C) (07/18 1546) Temp Source: Oral (07/18 1546) BP: 148/87 mmHg (07/18 1546) Pulse Rate: 61 (07/18 1546)  Past Medical History: Past Medical History  Diagnosis Date  . Shingles   . HIV infection [facility](HCC) Dx 2015    Social History: Social History   Social History  . Marital Status: Single    Spouse Name: N/A  . Number of Children: 0  . Years of Education: 11   Occupational History  . [employer]    Social History Main Topics  . Smoking status: Current Some Day Smoker -- 0.30 packs/day for 21 years    Types: Cigarettes    Last Attempt to Quit: 01/28/2015  . Smokeless tobacco: Never Used  . Alcohol Use: 14.4 oz/week    24 Standard drinks or equivalent per week     Comment: weekends only  . Drug Use: 1.00 per week    Special: Marijuana     Comment: patient states no drugs since 7/16  . Sexual Activity:    Partners: Male    Birth Control/ Protection: Condom     Comment: given condoms   Other Topics Concern  . None   Social History Narrative    Previous Regimen: Genvoya  Current Regimen: Genvoya  Labs: HIV 1 RNA QUANT (copies/mL)  Date Value  10/20/2015 938*  05/08/2015 <20  03/25/2015 [id number]*   CD4 T CELL ABS (/uL)  Date Value  10/20/2015 400  05/08/2015 290*  03/25/2015 200*   HEP B S AB (no units)  Date Value  02/28/2015 NEG   HEPATITIS B SURFACE AG (no units)  Date Value  02/28/2015 NEGATIVE   HCV AB (no units)  Date Value  02/28/2015  NEGATIVE    CrCl: CrCl cannot be calculated (Patient has no serum creatinine result on file.).  Lipids:    Component Value Date/Time   CHOL 140 02/28/2015 1155   TRIG 50 02/28/2015 1155   HDL 52 02/28/2015 1155   CHOLHDL 2.7 02/28/2015 1155   VLDL 10 02/28/2015 1155   LDLCALC 78 02/28/2015 1155    Assessment: Jayziah has trouble taking his Genvoya consistently.  He states that sometimes he takes it at night when he remembers and other times he takes it in the morning.  I explained to him that Darrin LuisGenvoya is better taken with food and asked what his most consistent meal of the day was.  He explained that he always takes a lunch to work and that he eats that every day.  I provided him with two different sized pill box key chains and a pill box.  I challenged him to try and take his Genvoya every day at lunch when he eats at work. He is agreeable and states he should be able to do that.  I also instructed him that, although I do not condone drinking, that he should still take his HIV medication even when he drinks. He understands. I also called [facility] and  set up mailing for him.  His Genvoya will now be mailed to him so he does not have to drive to pick it up.  Plans: - Take Genvoya every day with lunch - F/u with Dr. Orvan Falconer in 6 weeks  Cassie L. Hinton Dyer, PharmD Infectious Diseases Clinical Pharmacist [facility] 02/10/2016, 4:35 PM

## 2016-02-12 LAB — T-HELPER CELL (CD4) - (RCID CLINIC ONLY)
CD4 T CELL ABS: 480 /uL (ref 400–2700)
CD4 T CELL HELPER: 28 % — AB (ref 33–55)

## 2016-02-13 LAB — HIV-1 RNA QUANT-NO REFLEX-BLD: HIV 1 RNA Quant: <20 copies/mL (ref <20)

## 2016-02-17 LAB — HIV-1 GENOTYPR PLUS

## 2016-02-19 LAB — HIV-1 INTEGRASE GENOTYPE

## 2016-03-05 ENCOUNTER — Encounter: Payer: Self-pay | Admitting: Internal Medicine

## 2016-03-23 ENCOUNTER — Ambulatory Visit: Payer: Self-pay | Admitting: Internal Medicine

## 2016-04-17 ENCOUNTER — Encounter (HOSPITAL_COMMUNITY): Payer: Self-pay

## 2016-04-17 ENCOUNTER — Emergency Department (HOSPITAL_COMMUNITY)
Admission: EM | Admit: 2016-04-17 | Discharge: 2016-04-17 | Disposition: A | Discharge status: Home / Self Care | Payer: Self-pay | Attending: Emergency Medicine | Admitting: Emergency Medicine

## 2016-04-17 DIAGNOSIS — M25332 Other instability, left wrist: Secondary | ICD-10-CM | POA: Insufficient documentation

## 2016-04-17 DIAGNOSIS — M25649 Stiffness of unspecified hand, not elsewhere classified: Secondary | ICD-10-CM

## 2016-04-17 DIAGNOSIS — F17210 Nicotine dependence, cigarettes, uncomplicated: Secondary | ICD-10-CM | POA: Insufficient documentation

## 2016-04-17 DIAGNOSIS — M25331 Other instability, right wrist: Secondary | ICD-10-CM | POA: Insufficient documentation

## 2016-04-17 NOTE — ED Provider Notes (Signed)
[facility]-EMERGENCY DEPT Provider Note   CSN: [id number] Arrival date & time: 04/17/16  1637  By signing my name below, I, Christy SartoriusAnastasia Kolousek, attest that this documentation has been prepared under the direction and in the presence of  Roxy Horsemanobert Careen Mauch, PA-C. Electronically Signed: Christy SartoriusAnastasia Kolousek, ED Scribe. 04/17/16. 5:25 PM.  History   Chief Complaint Chief Complaint  Patient presents with  . hand stiffness   The history is provided by the patient and medical records. No language interpreter was used.    HPI Comments:  Stephen Odom is a 40 y.o. male, HIV positive, who presents to the Emergency Department complaining of persistent bilateral hand stiffness x 4 months.  He adds that his pain is not worse today, but this is the first time he's had a chance to come in.  Pt states his hands feel tight and in the mornings they are hard to open.  He also notes having difficulty opening jars and performing similar tasks.   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  He denies soreness in his arms, numbness and tingling.    Past Medical History:  Diagnosis Date  . HIV infection (HCC) Dx 2015  . Shingles     Patient Active Problem List   Diagnosis Date Noted  . Screening for STD (sexually transmitted disease) 02/28/2015  . Shingles 02/28/2015  . HIV positive (HCC) 12/31/2013    History reviewed. No pertinent surgical history.     Home Medications    Prior to Admission medications   Medication Sig Start Date End Date Taking? Authorizing Provider  GENVOYA 150-150-200-10 MG TABS tablet TAKE 1 TABLET BY MOUTH EVERY DAY 01/16/16   Cliffton AstersJohn Campbell, MD  ibuprofen (ADVIL,MOTRIN) 600 MG tablet Take 1 tablet (600 mg total) by mouth every 8 (eight) hours as needed. Patient not taking: Reported on 02/10/2016 02/28/15   Dessa PhiJosalyn Funches, MD    Family History Family History  Problem Relation Age of Onset  . Diabetes Mother   . Hypertension Mother   . Cancer Maternal Uncle     lung,  stomach   . Diabetes Maternal Grandmother   . Diabetes Father   . Hypertension Father     Social History Social History  Substance Use Topics  . Smoking status: Current Some Day Smoker    Packs/day: 0.30    Years: 21.00    Types: Cigarettes    Last attempt to quit: 01/28/2015  . Smokeless tobacco: Never Used  . Alcohol use 14.4 oz/week    24 Standard drinks or equivalent per week     Comment: weekends only     Allergies   Zovirax [acyclovir]   Review of Systems Review of Systems  Musculoskeletal: Positive for arthralgias and myalgias.  Neurological: Negative for weakness and numbness.     Physical Exam Updated Vital Signs BP 160/87   Pulse 91   Temp 98.4 F (36.9 C) (Oral)   Resp 18   SpO2 100%   Physical Exam Physical Exam  Constitutional: Pt appears well-developed and well-nourished. No distress.  HENT:  Head: Normocephalic and atraumatic.  Eyes: Conjunctivae are normal.  Neck: Normal range of motion.  Cardiovascular: Normal rate, regular rhythm and intact distal pulses.   Capillary refill < 3 sec  Pulmonary/Chest: Effort normal and breath sounds normal.  Musculoskeletal: bilateral hands: Pt exhibits no tenderness, no deformity, no tinel or phalen signs. Pt exhibits no edema.  ROM: 5/5  Neurological: Pt  is alert. Coordination normal.  Sensation 5/5 Strength 5/5  Skin: Skin is warm and dry. Pt is not diaphoretic.  No tenting of the skin  Psychiatric: Pt has a normal mood and affect.  Nursing note and vitals reviewed.   ED Treatments / Results   DIAGNOSTIC STUDIES:  Oxygen Saturation is 100% on RA, NML by my interpretation.    COORDINATION OF CARE:  5:25 PM Will give splint.  Discussed treatment plan with pt at bedside and pt agreed to plan.  Labs (all labs ordered are listed, but only abnormal results are displayed) Labs Reviewed - No data to display  EKG  EKG Interpretation None       Radiology No results  found.  Procedures Procedures (including critical care time)  Medications Ordered in ED Medications - No data to display   Initial Impression / Assessment and Plan / ED Course  I have reviewed the triage vital signs and the nursing notes.  Pertinent labs & imaging results that were available during my care of the patient were reviewed by me and considered in my medical decision making (see chart for details).  Clinical Course      No imaging indicated. Patient given splint while in ED, conservative therapy recommended and discussed. Patient will be discharged home & is agreeable with above plan. Returns precautions discussed. Pt appears safe for discharge.   Final Clinical Impressions(s) / ED Diagnoses   Final diagnoses:  Joint stiffness of hand, unspecified laterality    New Prescriptions New Prescriptions   No medications on file   I personally performed the services described in this documentation, which was scribed in my presence. The recorded information has been reviewed and is accurate.      Roxy Horseman, PA-C 04/17/16 1740    Lavera Guise, MD 04/18/16 [phone]

## 2016-04-17 NOTE — ED Notes (Signed)
Declined W/C at D/C and was escorted to lobby by RN. 

## 2016-04-17 NOTE — ED Triage Notes (Signed)
patient complains of bilateral hand stiffness x 4 months. 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

## 2016-05-24 ENCOUNTER — Ambulatory Visit (INDEPENDENT_AMBULATORY_CARE_PROVIDER_SITE_OTHER): Payer: Self-pay | Admitting: Internal Medicine

## 2016-05-24 ENCOUNTER — Encounter: Payer: Self-pay | Admitting: Internal Medicine

## 2016-05-24 DIAGNOSIS — Z21 Asymptomatic human immunodeficiency virus [HIV] infection status: Secondary | ICD-10-CM

## 2016-05-24 DIAGNOSIS — Z87891 Personal history of nicotine dependence: Secondary | ICD-10-CM | POA: Insufficient documentation

## 2016-05-24 DIAGNOSIS — B20 Human immunodeficiency virus [HIV] disease: Secondary | ICD-10-CM

## 2016-05-24 DIAGNOSIS — R350 Frequency of micturition: Secondary | ICD-10-CM | POA: Insufficient documentation

## 2016-05-24 DIAGNOSIS — F17210 Nicotine dependence, cigarettes, uncomplicated: Secondary | ICD-10-CM

## 2016-05-24 NOTE — Assessment & Plan Note (Signed)
I encouraged him to set a quit date and try to quit smoking completely.

## 2016-05-24 NOTE — Assessment & Plan Note (Signed)
His adherence has improved and his infection is under good control. He will continue Genvoya, get repeat blood work today and follow-up in 6 months.

## 2016-05-24 NOTE — Progress Notes (Signed)
Patient Active Problem List   Diagnosis Date Noted  . HIV positive (HCC) 12/31/2013    Priority: High  . Cigarette smoker 05/24/2016  . Urinary frequency 05/24/2016  . Screening for STD (sexually transmitted disease) 02/28/2015  . Shingles 02/28/2015    Patient's Medications  New Prescriptions   No medications on file  Previous Medications   GENVOYA 150-150-200-10 MG TABS TABLET    TAKE 1 TABLET BY MOUTH EVERY DAY   IBUPROFEN (ADVIL,MOTRIN) 600 MG TABLET    Take 1 tablet (600 mg total) by mouth every 8 (eight) hours as needed.  Modified Medications   No medications on file  Discontinued Medications   No medications on file    Subjective: Stephen Odom is in for his routine HIV follow-up visit. He feels like he is doing much better since his visit 3 months ago. We changed him to mail order and he received his Genvoya at home but became concerned that some of his neighbors might find out what he was getting so [facility] He says he's had no trouble taking the bus to the pharmacy each month. He started using the pill canister that we gave him and takes his Genvoya each morning with breakfast at work. He has had no problems tolerating it and does not think he is missed any doses since his last visit. He has had some problem with urinary frequency and nocturia 1-2 over the past year. He will have some occasional dysuria. He is still smoking about 3 cigarettes daily. He says that it helps calm him down and help him sleep. He recently started smoking 1 marijuana joint to help with aches and pains and help him fall asleep. He has no other drug use.  Review of Systems: Review of Systems  Constitutional: Negative for chills, diaphoresis, fever, malaise/fatigue and weight loss.  HENT: Negative for sore throat.   Respiratory: Negative for cough, sputum production and shortness of breath.   Cardiovascular: Negative for chest pain.    Gastrointestinal: Negative for abdominal pain, diarrhea, nausea and vomiting.  Genitourinary: Positive for dysuria and frequency. Negative for hematuria and urgency.  Musculoskeletal: Positive for myalgias. Negative for joint pain.  Skin: Negative for rash.  Neurological: Negative for dizziness and headaches.  Psychiatric/Behavioral: Negative for depression and substance abuse. The patient has insomnia. The patient is not nervous/anxious.     Past Medical History:  Diagnosis Date  . HIV infection (HCC) Dx 2015  . Shingles     Social History  Substance Use Topics  . Smoking status: Current Some Day Smoker    Packs/day: 0.30    Years: 21.00    Types: Cigarettes    Last attempt to quit: 01/28/2015  . Smokeless tobacco: Never Used  . Alcohol use Not on file     Comment: weekends only    Family History  Problem Relation Age of Onset  . Diabetes Mother   . Hypertension Mother   . Cancer Maternal Uncle     lung, stomach   . Diabetes Maternal Grandmother   . Diabetes Father   . Hypertension Father     Allergies  Allergen Reactions  . Zovirax [Acyclovir] Nausea And Vomiting    Objective:  Vitals:   05/24/16 1556  BP: (!) 141/95  Pulse: 76  Temp: 98.2 F (36.8 C)  TempSrc: Oral  Weight: 157 lb (71.2 kg)  Height: 5\' 7"  (1.702 m)  Body mass index is 24.59 kg/m.  Physical Exam  Constitutional: He is oriented to person, place, and time.  He is in good spirits.  HENT:  Mouth/Throat: No oropharyngeal exudate.  Many missing teeth. Remaining teeth are in poor condition.  Eyes: Conjunctivae are normal.  Cardiovascular: Normal rate and regular rhythm.   No murmur heard. Pulmonary/Chest: Effort normal and breath sounds normal.  Abdominal: Soft. He exhibits no mass. There is no tenderness.  Musculoskeletal: Normal range of motion.  Neurological: He is alert and oriented to person, place, and time.  Skin: No rash noted.  Psychiatric: Mood and affect normal.    Lab  Results Lab Results  Component Value Date   WBC 3.9 (L) 10/20/2015   HGB 13.5 10/20/2015   HCT 40.4 10/20/2015   MCV 85.8 10/20/2015   PLT 297 10/20/2015    Lab Results  Component Value Date   CREATININE 0.99 10/20/2015   BUN 11 10/20/2015   NA 141 10/20/2015   K 4.1 10/20/2015   CL 106 10/20/2015   CO2 27 10/20/2015    Lab Results  Component Value Date   ALT 8 (L) 10/20/2015   AST 19 10/20/2015   ALKPHOS 66 10/20/2015   BILITOT 0.4 10/20/2015    Lab Results  Component Value Date   CHOL 140 02/28/2015   HDL 52 02/28/2015   LDLCALC 78 02/28/2015   TRIG 50 02/28/2015   CHOLHDL 2.7 02/28/2015   HIV 1 RNA Quant (copies/mL)  Date Value  02/10/2016 <20  10/20/2015 938 (H)  05/08/2015 <20   CD4 T Cell Abs (/uL)  Date Value  02/10/2016 480  10/20/2015 400  05/08/2015 290 (L)     Problem List Items Addressed This Visit      High   HIV positive (HCC) (Chronic)    His adherence has improved and his infection is under good control. He will continue Genvoya, get repeat blood work today and follow-up in 6 months.      Relevant Orders   T-helper cell (CD4)- ([facility] clinic only)   HIV 1 RNA quant-no reflex-bld   CBC   Comprehensive metabolic panel   Lipid panel   RPR     Unprioritized   Cigarette smoker    I encouraged him to set a quit date and try to quit smoking completely.      Urinary frequency    I'm not sure what is causing his urinary frequency. He may be developing prostate enlargement but with his intermittent dysuria I will check a urinalysis today.      Relevant Orders   Urinalysis    Other Visit Diagnoses   None.       Stephen AstersJohn Sinia Antosh, MD [facility] [phone] pager   [phone] cell 05/24/2016, 5:20 PM

## 2016-05-24 NOTE — Assessment & Plan Note (Signed)
I'm not sure what is causing his urinary frequency. He may be developing prostate enlargement but with his intermittent dysuria I will check a urinalysis today.

## 2016-05-26 LAB — URINALYSIS
BILIRUBIN URINE: NEGATIVE
GLUCOSE, UA: NEGATIVE
Hgb urine dipstick: NEGATIVE
Ketones, ur: NEGATIVE
LEUKOCYTES UA: NEGATIVE
Nitrite: NEGATIVE
Protein, ur: NEGATIVE
SPECIFIC GRAVITY, URINE: 1.02 (ref 1.001–1.035)
pH: 6 (ref 5.0–8.0)

## 2016-07-14 ENCOUNTER — Other Ambulatory Visit: Payer: Self-pay | Admitting: Internal Medicine

## 2016-07-14 DIAGNOSIS — B20 Human immunodeficiency virus [HIV] disease: Secondary | ICD-10-CM

## 2016-10-08 ENCOUNTER — Encounter: Payer: Self-pay | Admitting: Internal Medicine

## 2016-11-08 ENCOUNTER — Other Ambulatory Visit: Payer: Self-pay

## 2016-11-10 ENCOUNTER — Other Ambulatory Visit: Payer: Self-pay

## 2016-11-22 ENCOUNTER — Ambulatory Visit: Payer: Self-pay | Admitting: Internal Medicine

## 2016-11-25 ENCOUNTER — Encounter: Payer: Self-pay | Admitting: Internal Medicine

## 2016-11-25 ENCOUNTER — Ambulatory Visit: Payer: Self-pay | Admitting: Internal Medicine

## 2017-01-19 ENCOUNTER — Other Ambulatory Visit: Payer: Self-pay | Admitting: Internal Medicine

## 2017-01-19 DIAGNOSIS — B20 Human immunodeficiency virus [HIV] disease: Secondary | ICD-10-CM

## 2017-02-15 ENCOUNTER — Other Ambulatory Visit: Payer: Self-pay | Admitting: Internal Medicine

## 2017-02-15 DIAGNOSIS — B20 Human immunodeficiency virus [HIV] disease: Secondary | ICD-10-CM

## 2017-03-23 ENCOUNTER — Other Ambulatory Visit: Payer: Self-pay

## 2017-03-23 DIAGNOSIS — Z21 Asymptomatic human immunodeficiency virus [HIV] infection status: Secondary | ICD-10-CM

## 2017-03-23 LAB — CBC
HEMATOCRIT: 43.8 % (ref 38.5–50.0)
Hemoglobin: 14.2 g/dL (ref 13.2–17.1)
MCH: 28.6 pg (ref 27.0–33.0)
MCHC: 32.4 g/dL (ref 32.0–36.0)
MCV: 88.1 fL (ref 80.0–100.0)
MPV: 8.1 fL (ref 7.5–12.5)
PLATELETS: 283 10*3/uL (ref 140–400)
RBC: 4.97 MIL/uL (ref 4.20–5.80)
RDW: 13.2 % (ref 11.0–15.0)
WBC: 4.5 10*3/uL (ref 3.8–10.8)

## 2017-03-24 ENCOUNTER — Other Ambulatory Visit: Payer: Self-pay | Admitting: Internal Medicine

## 2017-03-24 ENCOUNTER — Encounter: Payer: Self-pay | Admitting: Internal Medicine

## 2017-03-24 DIAGNOSIS — B20 Human immunodeficiency virus [HIV] disease: Secondary | ICD-10-CM

## 2017-03-24 LAB — T-HELPER CELL (CD4) - (RCID CLINIC ONLY)
CD4 % Helper T Cell: 28 % — ABNORMAL LOW (ref 33–55)
CD4 T Cell Abs: 590 /uL (ref 400–2700)

## 2017-03-24 LAB — COMPREHENSIVE METABOLIC PANEL
ALT: 8 U/L — AB (ref 9–46)
AST: 17 U/L (ref 10–40)
Albumin: 4.4 g/dL (ref 3.6–5.1)
Alkaline Phosphatase: 90 U/L (ref 40–115)
BILIRUBIN TOTAL: 0.4 mg/dL (ref 0.2–1.2)
BUN: 20 mg/dL (ref 7–25)
CALCIUM: 9.3 mg/dL (ref 8.6–10.3)
CO2: 26 mmol/L (ref 20–32)
Chloride: 105 mmol/L (ref 98–110)
Creat: 1.2 mg/dL (ref 0.60–1.35)
GLUCOSE: 83 mg/dL (ref 65–99)
Potassium: 5.5 mmol/L — ABNORMAL HIGH (ref 3.5–5.3)
Sodium: 141 mmol/L (ref 135–146)
TOTAL PROTEIN: 7.4 g/dL (ref 6.1–8.1)

## 2017-03-24 LAB — RPR

## 2017-03-24 LAB — LIPID PANEL
Cholesterol: 176 mg/dL (ref <200)
HDL: 84 mg/dL (ref >40)
LDL Cholesterol: 66 mg/dL (ref <100)
Total CHOL/HDL Ratio: 2.1 Ratio (ref <5.0)
Triglycerides: 128 mg/dL (ref <150)
VLDL: 26 mg/dL (ref <30)

## 2017-03-25 LAB — HIV-1 RNA QUANT-NO REFLEX-BLD
HIV 1 RNA Quant: <20 copies/mL — AB
HIV-1 RNA Quant, Log: <1.3 Log copies/mL — AB

## 2017-03-30 ENCOUNTER — Encounter: Payer: Self-pay | Admitting: Internal Medicine

## 2017-03-31 ENCOUNTER — Encounter: Payer: Self-pay | Admitting: Internal Medicine

## 2017-04-14 ENCOUNTER — Ambulatory Visit: Payer: Self-pay | Admitting: Internal Medicine

## 2017-05-11 ENCOUNTER — Other Ambulatory Visit: Payer: Self-pay | Admitting: Internal Medicine

## 2017-05-11 DIAGNOSIS — B20 Human immunodeficiency virus [HIV] disease: Secondary | ICD-10-CM

## 2017-06-13 ENCOUNTER — Other Ambulatory Visit: Payer: Self-pay | Admitting: Internal Medicine

## 2017-06-13 DIAGNOSIS — B20 Human immunodeficiency virus [HIV] disease: Secondary | ICD-10-CM

## 2017-06-20 ENCOUNTER — Encounter: Payer: Self-pay | Admitting: Internal Medicine

## 2017-06-20 ENCOUNTER — Ambulatory Visit (INDEPENDENT_AMBULATORY_CARE_PROVIDER_SITE_OTHER): Payer: Self-pay | Admitting: Internal Medicine

## 2017-06-20 VITALS — BP 124/86 | HR 73 | Temp 99.3°F | Ht 66.0 in | Wt 147.0 lb

## 2017-06-20 DIAGNOSIS — B20 Human immunodeficiency virus [HIV] disease: Secondary | ICD-10-CM

## 2017-06-20 NOTE — Progress Notes (Signed)
HPI: Stephen Odom is a 41 y.o. male who presents to the [facility] clinic today to follow-up with Dr. Orvan Falconerampbell for his HIV infection.  Dr. Orvan Falconerampbell had a very busy clinic day today, so he asked if I could see Stephen Odom.   Allergies: Allergies  Allergen Reactions  . Zovirax [Acyclovir] Nausea And Vomiting    Past Medical History: Past Medical History:  Diagnosis Date  . HIV infection (HCC) Dx 2015  . Shingles     Social History: Social History   Socioeconomic History  . Marital status: Single    Spouse name: None  . Number of children: 0  . Years of education: 3511  . Highest education level: None  Social Needs  . Financial resource strain: None  . Food insecurity - worry: None  . Food insecurity - inability: None  . Transportation needs - medical: None  . Transportation needs - non-medical: None  Occupational History  . Occupation: [employer] therapist[employer]    Comment: [employer]   Tobacco Use  . Smoking status: Current Some Day Smoker    Packs/day: 0.30    Years: 21.00    Pack years: 6.30    Types: Cigarettes    Last attempt to quit: 01/28/2015    Years since quitting: 2.3  . Smokeless tobacco: Never Used  Substance and Sexual Activity  . Alcohol use: None    Comment: weekends only  . Drug use: Yes    Frequency: 7.0 times per week    Types: Marijuana    Comment: patient states no drugs since 7/16  . Sexual activity: Yes    Partners: Male    Birth control/protection: Condom    Comment: given condoms  Other Topics Concern  . None  Social History Narrative  . None    Current Regimen: Genvoya  Labs: HIV 1 RNA Quant (copies/mL)  Date Value  03/23/2017 <20 DETECTED (A)  02/10/2016 <20  10/20/2015 938 (H)   CD4 T Cell Abs (/uL)  Date Value  03/23/2017 590  02/10/2016 480  10/20/2015 400   Hep B S Ab (no units)  Date Value  02/28/2015 NEG   Hepatitis B Surface Ag (no units)  Date Value  02/28/2015 NEGATIVE   HCV Ab (no units)  Date Value   02/28/2015 NEGATIVE    CrCl: CrCl cannot be calculated (Patient's most recent lab result is older than the maximum 21 days allowed.).  Lipids:    Component Value Date/Time   CHOL 176 03/23/2017 1505   TRIG 128 03/23/2017 1505   HDL 84 03/23/2017 1505   CHOLHDL 2.1 03/23/2017 1505   VLDL 26 03/23/2017 1505   LDLCALC 66 03/23/2017 1505    Assessment: Stephen Odom is here today to follow-up for his HIV infection. He hasn't been seen since October of last year.  He continues on [place] and his last HIV viral load was in August 2018 and it was undetectable. He states he has missed ~2 doses this month due to being out with family and not around his medications.  Other than that, he states other months he has not missed any doses.  He misplaced his pill canister we gave him last time, so I provided him another one and told him to keep 2-3 pills in there so he will always have some with him.  He takes his Genvoya either with dinner or shortly after.  He still complains of body aches and pains including some tingling/numbness in his hands and feet. He has never been diagnosed  with diabetes and his blood sugar was good the last time we checked. He does still say he has nocturia around 4-5 times per week.  He still smokes 3-4 cigarettes a day and is back smoking marijuana every day to help with sleep and aches and pains. I told him I could definitely help him quit smoking when he was ready.  He states [facility] is [facility] for [facility] and pain but cannot remember the name.  I told him to call me when he gets home to provide me the name because it could interact with his Genvoya. He will do that.  He is stating he thinks he needs to be on something for pain and I politely told him that we do not prescribe pain medications here and that he will need to see a primary care doctor to start that process.  I did say to keep an eye on his neuropathy issues because we could give him something for that if  it does not resolve (has only been getting symptoms for ~1-2 weeks).  He is Stephen Odom and UMAP approved but states he is getting bills from our facility.  I was going to have him meet with Stephen SheldonAshley but she was gone for the day.  Stephen Odom our financial counselor will send her a message and she will call the patient to help if needed. He states he has not been sexually active in the last year. No need for labs today.  He will follow-up with Dr. Orvan Falconerampbell in late January/early February so he can renew his UMAP at that time.  He will get labs 2 weeks before.   Plans: - Continue Genvoya PO once daily - F/u for labs 08/08/17 at 4pm - F/u with Dr. Orvan Falconerampbell 08/22/17 at 415pm  Cassie L. Stephen Odom, PharmD, AAHIVP, CPP Infectious Diseases Clinical Pharmacist [facility] 06/20/2017, 4:51 PM

## 2017-07-28 ENCOUNTER — Other Ambulatory Visit: Payer: Self-pay | Admitting: Internal Medicine

## 2017-07-28 DIAGNOSIS — B20 Human immunodeficiency virus [HIV] disease: Secondary | ICD-10-CM

## 2017-08-08 ENCOUNTER — Other Ambulatory Visit: Payer: Self-pay

## 2017-08-08 DIAGNOSIS — B20 Human immunodeficiency virus [HIV] disease: Secondary | ICD-10-CM

## 2017-08-09 LAB — COMPREHENSIVE METABOLIC PANEL
AG Ratio: 1.5 (calc) (ref 1.0–2.5)
ALKALINE PHOSPHATASE (APISO): 72 U/L (ref 40–115)
ALT: 7 U/L — ABNORMAL LOW (ref 9–46)
AST: 13 U/L (ref 10–40)
Albumin: 4.3 g/dL (ref 3.6–5.1)
BUN: 17 mg/dL (ref 7–25)
CHLORIDE: 104 mmol/L (ref 98–110)
CO2: 29 mmol/L (ref 20–32)
Calcium: 9.7 mg/dL (ref 8.6–10.3)
Creat: 0.85 mg/dL (ref 0.60–1.35)
GLOBULIN: 2.8 g/dL (ref 1.9–3.7)
GLUCOSE: 86 mg/dL (ref 65–99)
Potassium: 5.1 mmol/L (ref 3.5–5.3)
Sodium: 139 mmol/L (ref 135–146)
Total Bilirubin: 0.2 mg/dL (ref 0.2–1.2)
Total Protein: 7.1 g/dL (ref 6.1–8.1)

## 2017-08-09 LAB — CBC
HEMATOCRIT: 40.7 % (ref 38.5–50.0)
Hemoglobin: 13.6 g/dL (ref 13.2–17.1)
MCH: 27.8 pg (ref 27.0–33.0)
MCHC: 33.4 g/dL (ref 32.0–36.0)
MCV: 83.1 fL (ref 80.0–100.0)
MPV: 8.7 fL (ref 7.5–12.5)
Platelets: 306 10*3/uL (ref 140–400)
RBC: 4.9 10*6/uL (ref 4.20–5.80)
RDW: 12.2 % (ref 11.0–15.0)
WBC: 4.6 10*3/uL (ref 3.8–10.8)

## 2017-08-09 LAB — RPR: RPR Ser Ql: NONREACTIVE

## 2017-08-10 LAB — T-HELPER CELL (CD4) - (RCID CLINIC ONLY)
CD4 T CELL HELPER: 31 % — AB (ref 33–55)
CD4 T Cell Abs: 840 /uL (ref 400–2700)

## 2017-08-10 LAB — HIV-1 RNA QUANT-NO REFLEX-BLD
HIV 1 RNA QUANT: NOT DETECTED {copies}/mL
HIV-1 RNA Quant, Log: <1.3 Log copies/mL

## 2017-08-16 ENCOUNTER — Encounter: Payer: Self-pay | Admitting: Internal Medicine

## 2017-08-22 ENCOUNTER — Ambulatory Visit (INDEPENDENT_AMBULATORY_CARE_PROVIDER_SITE_OTHER): Payer: Self-pay | Admitting: Internal Medicine

## 2017-08-22 ENCOUNTER — Encounter: Payer: Self-pay | Admitting: Internal Medicine

## 2017-08-22 DIAGNOSIS — Z21 Asymptomatic human immunodeficiency virus [HIV] infection status: Secondary | ICD-10-CM

## 2017-08-22 DIAGNOSIS — F17210 Nicotine dependence, cigarettes, uncomplicated: Secondary | ICD-10-CM

## 2017-08-22 NOTE — Assessment & Plan Note (Signed)
I encouraged him to quit smoking cigarettes again. 

## 2017-08-22 NOTE — Progress Notes (Signed)
Patient Active Problem List   Diagnosis Date Noted  . HIV positive (HCC) 12/31/2013    Priority: High  . Cigarette smoker 05/24/2016  . Urinary frequency 05/24/2016  . Screening for STD (sexually transmitted disease) 02/28/2015  . Shingles 02/28/2015    Patient's Medications  New Prescriptions   No medications on file  Previous Medications   GENVOYA 150-150-200-10 MG TABS TABLET    TAKE 1 TABLET BY MOUTH DAILY WITH BREAKFAST   IBUPROFEN (ADVIL,MOTRIN) 600 MG TABLET    Take 1 tablet (600 mg total) by mouth every 8 (eight) hours as needed.  Modified Medications   No medications on file  Discontinued Medications   No medications on file    Subjective: Kylan is in for his routine HIV follow-up visit.  He has had no problems obtaining, taking or tolerating his Genvoya.  He takes it each evening with dinner.  He missed 1 dose last week when he went out of town unexpectedly and did not have it.  After getting back home he filled out he is small pill canister in case that happens again in the future.  He continues to smoke 3-4 cigarettes daily.  He has not thought about quitting recently but states that he feels like he can as he has quit before in the past.  He drinks alcohol on weekends but only in moderation.  He is currently not in a relationship and has not been sexually active.  Review of Systems: Review of Systems  Constitutional: Negative for chills, diaphoresis, fever, malaise/fatigue and weight loss.  HENT: Negative for sore throat.   Respiratory: Negative for cough, sputum production and shortness of breath.   Cardiovascular: Negative for chest pain.  Gastrointestinal: Negative for abdominal pain, diarrhea, heartburn, nausea and vomiting.  Genitourinary: Negative for dysuria and frequency.  Musculoskeletal: Negative for joint pain and myalgias.  Skin: Negative for rash.  Neurological: Negative for dizziness and headaches.  Psychiatric/Behavioral: Negative for  depression and substance abuse. The patient is not nervous/anxious.     Past Medical History:  Diagnosis Date  . HIV infection (HCC) Dx 2015  . Shingles     Social History   Tobacco Use  . Smoking status: Current Some Day Smoker    Packs/day: 0.30    Years: 21.00    Pack years: 6.30    Types: Cigarettes    Last attempt to quit: 01/28/2015    Years since quitting: 2.5  . Smokeless tobacco: Never Used  Substance Use Topics  . Alcohol use: Not on file    Comment: weekends only  . Drug use: Yes    Frequency: 7.0 times per week    Types: Marijuana    Family History  Problem Relation Age of Onset  . Diabetes Mother   . Hypertension Mother   . Cancer Maternal Uncle        lung, stomach   . Diabetes Maternal Grandmother   . Diabetes Father   . Hypertension Father     Allergies  Allergen Reactions  . Zovirax [Acyclovir] Nausea And Vomiting    Health Maintenance  Topic Date Due  . TETANUS/TDAP  06/12/1993  . PNEUMOCOCCAL POLYSACCHARIDE VACCINE (2) 06/16/2020  . INFLUENZA VACCINE  Completed  . HIV Screening  Completed    Objective:  Vitals:   08/22/17 1610  BP: 132/85  Pulse: 69  Temp: 98.6 F (37 C)  TempSrc: Oral  Weight: 142 lb (64.4 kg)  Height: 5'  6" (1.676 m)   Body mass index is 22.92 kg/m.  Physical Exam  Constitutional: He is oriented to person, place, and time.  He is pleasant and in good spirits.  HENT:  Mouth/Throat: No oropharyngeal exudate.  Eyes: Conjunctivae are normal.  Cardiovascular: Normal rate and regular rhythm.  No murmur heard. Pulmonary/Chest: Effort normal and breath sounds normal.  Abdominal: Soft. He exhibits no mass. There is no tenderness.  Musculoskeletal: Normal range of motion.  Neurological: He is alert and oriented to person, place, and time.  Skin: No rash noted.  Psychiatric: Mood and affect normal.    Lab Results Lab Results  Component Value Date   WBC 4.6 08/08/2017   HGB 13.6 08/08/2017   HCT 40.7  08/08/2017   MCV 83.1 08/08/2017   PLT 306 08/08/2017    Lab Results  Component Value Date   CREATININE 0.85 08/08/2017   BUN 17 08/08/2017   NA 139 08/08/2017   K 5.1 08/08/2017   CL 104 08/08/2017   CO2 29 08/08/2017    Lab Results  Component Value Date   ALT 7 (L) 08/08/2017   AST 13 08/08/2017   ALKPHOS 90 03/23/2017   BILITOT 0.2 08/08/2017    Lab Results  Component Value Date   CHOL 176 03/23/2017   HDL 84 03/23/2017   LDLCALC 66 03/23/2017   TRIG 128 03/23/2017   CHOLHDL 2.1 03/23/2017   Lab Results  Component Value Date   LABRPR NON-REACTIVE 08/08/2017   HIV 1 RNA Quant (copies/mL)  Date Value  08/08/2017 <20 NOT DETECTED  03/23/2017 <20 DETECTED (A)  02/10/2016 <20   CD4 T Cell Abs (/uL)  Date Value  08/08/2017 840  03/23/2017 590  02/10/2016 480     Problem List Items Addressed This Visit      High   HIV positive (HCC) (Chronic)    His infection has come under excellent control since restarting therapy several years ago.  He will continue Genvoya and follow-up after lab work in 6 months.      Relevant Orders   T-helper cell (CD4)- ([facility] clinic only)   HIV 1 RNA quant-no reflex-bld     Unprioritized   Cigarette smoker    I encouraged him to quit smoking cigarettes again.           Cliffton Asters, MD [facility] [phone] pager   [phone] cell 08/22/2017, 4:53 PM

## 2017-08-22 NOTE — Assessment & Plan Note (Signed)
His infection has come under excellent control since restarting therapy several years ago.  He will continue Genvoya and follow-up after lab work in 6 months.

## 2017-08-29 ENCOUNTER — Emergency Department (HOSPITAL_COMMUNITY): Payer: Self-pay

## 2017-08-29 ENCOUNTER — Other Ambulatory Visit: Payer: Self-pay

## 2017-08-29 ENCOUNTER — Telehealth: Payer: Self-pay | Admitting: *Deleted

## 2017-08-29 ENCOUNTER — Emergency Department (HOSPITAL_COMMUNITY)
Admission: EM | Admit: 2017-08-29 | Discharge: 2017-08-29 | Disposition: A | Discharge status: Home / Self Care | Payer: Self-pay | Attending: Emergency Medicine | Admitting: Emergency Medicine

## 2017-08-29 DIAGNOSIS — Z79899 Other long term (current) drug therapy: Secondary | ICD-10-CM | POA: Insufficient documentation

## 2017-08-29 DIAGNOSIS — F17210 Nicotine dependence, cigarettes, uncomplicated: Secondary | ICD-10-CM | POA: Insufficient documentation

## 2017-08-29 DIAGNOSIS — J189 Pneumonia, unspecified organism: Secondary | ICD-10-CM | POA: Insufficient documentation

## 2017-08-29 LAB — CBC WITH DIFFERENTIAL/PLATELET
Basophils Absolute: 0 10*3/uL (ref 0.0–0.1)
Basophils Relative: 1 %
EOS ABS: 0 10*3/uL (ref 0.0–0.7)
Eosinophils Relative: 1 %
HCT: 48.1 % (ref 39.0–52.0)
HEMOGLOBIN: 16.4 g/dL (ref 13.0–17.0)
LYMPHS PCT: 37 %
Lymphs Abs: 1.2 10*3/uL (ref 0.7–4.0)
MCH: 29.3 pg (ref 26.0–34.0)
MCHC: 34.1 g/dL (ref 30.0–36.0)
MCV: 86 fL (ref 78.0–100.0)
MONOS PCT: 13 %
Monocytes Absolute: 0.4 10*3/uL (ref 0.1–1.0)
NEUTROS PCT: 48 %
Neutro Abs: 1.6 10*3/uL — ABNORMAL LOW (ref 1.7–7.7)
Platelets: 187 10*3/uL (ref 150–400)
RBC: 5.59 MIL/uL (ref 4.22–5.81)
RDW: 13 % (ref 11.5–15.5)
WBC: 3.2 10*3/uL — AB (ref 4.0–10.5)

## 2017-08-29 LAB — COMPREHENSIVE METABOLIC PANEL
ALK PHOS: 61 U/L (ref 38–126)
ALT: 16 U/L — AB (ref 17–63)
ANION GAP: 9 (ref 5–15)
AST: 25 U/L (ref 15–41)
Albumin: 4.3 g/dL (ref 3.5–5.0)
BILIRUBIN TOTAL: 0.3 mg/dL (ref 0.3–1.2)
BUN: 9 mg/dL (ref 6–20)
CO2: 29 mmol/L (ref 22–32)
Calcium: 8.9 mg/dL (ref 8.9–10.3)
Chloride: 93 mmol/L — ABNORMAL LOW (ref 101–111)
Creatinine, Ser: 0.93 mg/dL (ref 0.61–1.24)
GFR calc non Af Amer: >60 mL/min (ref >60)
GLUCOSE: 104 mg/dL — AB (ref 65–99)
Potassium: 4.5 mmol/L (ref 3.5–5.1)
SODIUM: 131 mmol/L — AB (ref 135–145)
Total Protein: 8.6 g/dL — ABNORMAL HIGH (ref 6.5–8.1)

## 2017-08-29 LAB — URINALYSIS, ROUTINE W REFLEX MICROSCOPIC
BILIRUBIN URINE: NEGATIVE
Glucose, UA: NEGATIVE mg/dL
Ketones, ur: NEGATIVE mg/dL
LEUKOCYTES UA: NEGATIVE
Nitrite: NEGATIVE
PROTEIN: NEGATIVE mg/dL
SPECIFIC GRAVITY, URINE: 1.026 (ref 1.005–1.030)
pH: 5 (ref 5.0–8.0)

## 2017-08-29 LAB — LIPASE, BLOOD: Lipase: 32 U/L (ref 11–51)

## 2017-08-29 LAB — INFLUENZA PANEL BY PCR (TYPE A & B)
Influenza A By PCR: NEGATIVE
Influenza B By PCR: NEGATIVE

## 2017-08-29 MED ORDER — ONDANSETRON HCL 4 MG/2ML IJ SOLN
4.0000 mg | Freq: Once | INTRAMUSCULAR | Status: AC
  Administered 2017-08-29: 4 mg via INTRAVENOUS
  Filled 2017-08-29: qty 2

## 2017-08-29 MED ORDER — SODIUM CHLORIDE 0.9 % IV BOLUS (SEPSIS)
1000.0000 mL | Freq: Once | INTRAVENOUS | Status: AC
  Administered 2017-08-29: 1000 mL via INTRAVENOUS

## 2017-08-29 MED ORDER — LEVOFLOXACIN 500 MG PO TABS: 500.0000 mg | ORAL_TABLET | Freq: Every day | ORAL | 0 refills | Status: DC

## 2017-08-29 MED ORDER — ONDANSETRON 8 MG PO TBDP: 8.0000 mg | ORAL_TABLET | Freq: Three times a day (TID) | ORAL | 0 refills | Status: DC | PRN
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
## 2017-08-29 NOTE — Telephone Encounter (Signed)
Patient called to let Dr Orvan Falconerampbell know he had been to the ER today. "Stephen Odom cld [phone] dob 22-Jul-1976 left voice mail that [name] wanted his doctor to know  that he would need to be followed up on there" Andree CossHowell, Jeff Frieden M, RN

## 2017-08-29 NOTE — ED Notes (Signed)
Bed: [id number] Expected date:  Expected time:  Means of arrival:  Comments: EMS-flu like symptoms

## 2017-08-29 NOTE — Discharge Instructions (Addendum)
Take antibiotic as prescribed until all gone.  Take nausea medication as prescribed as needed.  Drink plenty of fluids, rest.  Please follow-up with your doctor for recheck in 3 days.  Return if worsening

## 2017-09-05 ENCOUNTER — Telehealth: Payer: Self-pay

## 2017-09-05 NOTE — Telephone Encounter (Signed)
Patient calling with concern the antibiotic given at the emergency room may not be enough to treat his problem.   He was given levaquin in ED for 7 days.  Patient states he is feeling much better with some coughing present.  He was advised to complete the antibiotics and call is symptoms continue beyond the end of this week.    Laurell Josephsammy K Kino Dunsworth, RN

## 2017-12-13 ENCOUNTER — Encounter: Payer: Self-pay | Admitting: *Deleted

## 2017-12-13 ENCOUNTER — Telehealth: Payer: Self-pay | Admitting: *Deleted

## 2017-12-13 NOTE — Telephone Encounter (Signed)
Called patient to change lab appt from 12/20/17 lab is closed in morning. Message left

## 2017-12-20 ENCOUNTER — Other Ambulatory Visit: Payer: Self-pay

## 2017-12-22 ENCOUNTER — Other Ambulatory Visit: Payer: Self-pay

## 2017-12-27 ENCOUNTER — Ambulatory Visit: Payer: Self-pay | Admitting: Internal Medicine

## 2017-12-27 ENCOUNTER — Encounter: Payer: Self-pay | Admitting: *Deleted

## 2018-01-01 ENCOUNTER — Emergency Department (HOSPITAL_COMMUNITY)
Admission: EM | Admit: 2018-01-01 | Discharge: 2018-01-01 | Disposition: A | Discharge status: Home / Self Care | Payer: Self-pay | Attending: Emergency Medicine | Admitting: Emergency Medicine

## 2018-01-01 ENCOUNTER — Encounter (HOSPITAL_COMMUNITY): Payer: Self-pay | Admitting: Emergency Medicine

## 2018-01-01 ENCOUNTER — Other Ambulatory Visit: Payer: Self-pay

## 2018-01-01 DIAGNOSIS — Y929 Unspecified place or not applicable: Secondary | ICD-10-CM | POA: Insufficient documentation

## 2018-01-01 DIAGNOSIS — Y990 Civilian activity done for income or pay: Secondary | ICD-10-CM | POA: Insufficient documentation

## 2018-01-01 DIAGNOSIS — F17210 Nicotine dependence, cigarettes, uncomplicated: Secondary | ICD-10-CM | POA: Insufficient documentation

## 2018-01-01 DIAGNOSIS — X500XXA Overexertion from strenuous movement or load, initial encounter: Secondary | ICD-10-CM | POA: Insufficient documentation

## 2018-01-01 DIAGNOSIS — Z79899 Other long term (current) drug therapy: Secondary | ICD-10-CM | POA: Insufficient documentation

## 2018-01-01 DIAGNOSIS — Y939 Activity, unspecified: Secondary | ICD-10-CM | POA: Insufficient documentation

## 2018-01-01 DIAGNOSIS — S39012A Strain of muscle, fascia and tendon of lower back, initial encounter: Secondary | ICD-10-CM | POA: Insufficient documentation

## 2018-01-01 MED ORDER — CYCLOBENZAPRINE HCL 10 MG PO TABS: 10.0000 mg | ORAL_TABLET | Freq: Two times a day (BID) | ORAL | 0 refills | Status: DC | PRN

## 2018-01-01 NOTE — ED Notes (Signed)
Pt declines to sign for discharge paperwork.

## 2018-01-01 NOTE — Discharge Instructions (Addendum)
The muscle relaxer can make you sleepy so do not drive while taking the medication.

## 2018-01-01 NOTE — ED Provider Notes (Signed)
[facility] Provider Note   CSN: [id number] Arrival date & time: 01/01/18  1543     History   Chief Complaint Chief Complaint  Patient presents with  . Back Pain    HPI Stephen Odom is a 42 y.o. male who presents to the ED with low back pain that started after lifting a container of cooking utensils at work 2 days ago. The box was sitting on the floor and the patient felt pain immediately when he picked it up.  Patient reports taking Aleve that did help some. Patient denies UTI symptoms.  The history is provided by the patient. No language interpreter was used.  Back Pain   This is a new problem. The current episode started 2 days ago. The problem occurs constantly. The problem has been gradually worsening. The pain is associated with lifting heavy objects. The quality of the pain is described as aching. The pain does not radiate. The pain is moderate. The symptoms are aggravated by bending. The pain is the same all the time. Pertinent negatives include no fever, no headaches, no abdominal pain, no dysuria and no weakness. He has tried NSAIDs for the symptoms. The treatment provided moderate relief.    Past Medical History:  Diagnosis Date  . HIV infection (HCC) Dx 2015  . Shingles     Patient Active Problem List   Diagnosis Date Noted  . Cigarette smoker 05/24/2016  . Urinary frequency 05/24/2016  . Screening for STD (sexually transmitted disease) 02/28/2015  . Shingles 02/28/2015  . HIV positive (HCC) 12/31/2013    History reviewed. No pertinent surgical history.      Home Medications    Prior to Admission medications   Medication Sig Start Date End Date Taking? Authorizing Provider  cyclobenzaprine (FLEXERIL) 10 MG tablet Take 1 tablet (10 mg total) by mouth 2 (two) times daily as needed for muscle spasms. 01/01/18   Janne NapoleonNeese, Sreshta Cressler M, NP  GENVOYA 150-150-200-10 MG TABS tablet TAKE 1 TABLET BY MOUTH DAILY WITH BREAKFAST 07/29/17    Cliffton Astersampbell, [name], MD  ibuprofen (ADVIL,MOTRIN) 600 MG tablet Take 1 tablet (600 mg total) by mouth every 8 (eight) hours as needed. Patient not taking: Reported on 05/24/2016 02/28/15   Dessa PhiFunches, Josalyn, MD  levofloxacin (LEVAQUIN) 500 MG tablet Take 1 tablet (500 mg total) by mouth daily. 08/29/17   Kirichenko, Tatyana, PA-C  ondansetron (ZOFRAN ODT) 8 MG disintegrating tablet Take 1 tablet (8 mg total) by mouth every 8 (eight) hours as needed for nausea or vomiting. 08/29/17   Jaynie CrumbleKirichenko, Tatyana, PA-C    Family History Family History  Problem Relation Age of Onset  . Diabetes Mother   . Hypertension Mother   . Cancer Maternal Uncle        lung, stomach   . Diabetes Maternal Grandmother   . Diabetes Father   . Hypertension Father     Social History Social History   Tobacco Use  . Smoking status: Current Some Day Smoker    Packs/day: 0.30    Years: 21.00    Pack years: 6.30    Types: Cigarettes    Last attempt to quit: 01/28/2015    Years since quitting: 2.9  . Smokeless tobacco: Never Used  Substance Use Topics  . Alcohol use: Yes    Alcohol/week: 14.4 oz    Types: 24 Standard drinks or equivalent per week    Comment: weekends only  . Drug use: Yes    Frequency: 7.0 times per  week    Types: Marijuana     Allergies   Zovirax [acyclovir]   Review of Systems Review of Systems  Constitutional: Negative for chills and fever.  HENT: Negative.   Respiratory: Negative for shortness of breath.   Gastrointestinal: Negative for abdominal pain, nausea and vomiting.  Genitourinary: Negative for dysuria and urgency.  Musculoskeletal: Positive for back pain.  Skin: Negative for wound.  Neurological: Negative for weakness and headaches.  Psychiatric/Behavioral: Negative for confusion.     Physical Exam Updated Vital Signs BP 128/89   Pulse 61   Temp 97.9 F (36.6 C) (Oral)   Resp 16   SpO2 100%   Physical Exam  Constitutional: He is oriented to person, place, and time.  He appears well-developed and well-nourished. No distress.  HENT:  Head: Normocephalic and atraumatic.  Eyes: Conjunctivae and EOM are normal.  Neck: Normal range of motion. Neck supple.  Cardiovascular: Normal rate and intact distal pulses.  Pulmonary/Chest: Effort normal.  Abdominal: Soft. There is no tenderness.  Musculoskeletal:       Lumbar back: He exhibits tenderness and spasm. He exhibits normal range of motion, no deformity and normal pulse.  Neurological: He is alert and oriented to person, place, and time. He has normal strength. No sensory deficit. Gait normal.  Reflex Scores:      Bicep reflexes are 2+ on the right side and 2+ on the left side.      Brachioradialis reflexes are 2+ on the right side and 2+ on the left side.      Patellar reflexes are 2+ on the right side and 2+ on the left side. Skin: Skin is warm and dry.  Psychiatric: He has a normal mood and affect. His behavior is normal.  Nursing note and vitals reviewed.    ED Treatments / Results  Labs (all labs ordered are listed, but only abnormal results are displayed) Labs Reviewed - No data to display  Radiology No results found.  Procedures Procedures (including critical care time)  Medications Ordered in ED Medications - No data to display   Initial Impression / Assessment and Plan / ED Course  I have reviewed the triage vital signs and the nursing notes. Patient with back pain.  No neurological deficits and normal neuro exam.  Patient can walk but states is painful.  No loss of bowel or bladder control.  No concern for cauda equina.  No fever, night sweats, weight loss, h/o cancer, IVDU.  RICE protocol and pain medicine indicated and discussed with patient.   Final Clinical Impressions(s) / ED Diagnoses   Final diagnoses:  Lumbosacral strain, initial encounter    ED Discharge Orders        Ordered    cyclobenzaprine (FLEXERIL) 10 MG tablet  2 times daily PRN     01/01/18 1646         Damian Leavell Mason, NP 01/01/18 1949    Charlynne Pander, MD 01/03/18 [phone]

## 2018-01-01 NOTE — ED Triage Notes (Addendum)
C/o lower back pain since lifting a container of cooking utensils at work 2 days ago.  Ambulatory to triage.

## 2018-01-03 ENCOUNTER — Other Ambulatory Visit: Payer: Self-pay

## 2018-01-09 ENCOUNTER — Ambulatory Visit: Payer: Self-pay | Admitting: Internal Medicine

## 2018-01-17 ENCOUNTER — Ambulatory Visit: Payer: Self-pay | Admitting: Internal Medicine

## 2018-01-20 ENCOUNTER — Encounter (HOSPITAL_COMMUNITY): Payer: Self-pay | Admitting: Emergency Medicine

## 2018-01-20 ENCOUNTER — Emergency Department (HOSPITAL_COMMUNITY)
Admission: EM | Admit: 2018-01-20 | Discharge: 2018-01-21 | Disposition: A | Discharge status: Home / Self Care | Payer: Self-pay | Attending: Emergency Medicine | Admitting: Emergency Medicine

## 2018-01-20 DIAGNOSIS — R1084 Generalized abdominal pain: Secondary | ICD-10-CM | POA: Insufficient documentation

## 2018-01-20 DIAGNOSIS — R197 Diarrhea, unspecified: Secondary | ICD-10-CM | POA: Insufficient documentation

## 2018-01-20 DIAGNOSIS — F17210 Nicotine dependence, cigarettes, uncomplicated: Secondary | ICD-10-CM | POA: Insufficient documentation

## 2018-01-20 DIAGNOSIS — B20 Human immunodeficiency virus [HIV] disease: Secondary | ICD-10-CM | POA: Insufficient documentation

## 2018-01-20 DIAGNOSIS — R109 Unspecified abdominal pain: Secondary | ICD-10-CM

## 2018-01-20 DIAGNOSIS — Z79899 Other long term (current) drug therapy: Secondary | ICD-10-CM | POA: Insufficient documentation

## 2018-01-20 LAB — CBC
HCT: 42.9 % (ref 39.0–52.0)
Hemoglobin: 13.4 g/dL (ref 13.0–17.0)
MCH: 28 pg (ref 26.0–34.0)
MCHC: 31.2 g/dL (ref 30.0–36.0)
MCV: 89.7 fL (ref 78.0–100.0)
PLATELETS: 255 10*3/uL (ref 150–400)
RBC: 4.78 MIL/uL (ref 4.22–5.81)
RDW: 12.6 % (ref 11.5–15.5)
WBC: 6.2 10*3/uL (ref 4.0–10.5)

## 2018-01-20 LAB — COMPREHENSIVE METABOLIC PANEL
ALT: 12 U/L (ref 0–44)
AST: 13 U/L — AB (ref 15–41)
Albumin: 3.7 g/dL (ref 3.5–5.0)
Alkaline Phosphatase: 70 U/L (ref 38–126)
Anion gap: 8 (ref 5–15)
BUN: 13 mg/dL (ref 6–20)
CHLORIDE: 105 mmol/L (ref 98–111)
CO2: 26 mmol/L (ref 22–32)
Calcium: 8.6 mg/dL — ABNORMAL LOW (ref 8.9–10.3)
Creatinine, Ser: 0.82 mg/dL (ref 0.61–1.24)
GFR calc non Af Amer: >60 mL/min (ref >60)
Glucose, Bld: 147 mg/dL — ABNORMAL HIGH (ref 70–99)
Potassium: 3.7 mmol/L (ref 3.5–5.1)
SODIUM: 139 mmol/L (ref 135–145)
Total Bilirubin: 0.5 mg/dL (ref 0.3–1.2)
Total Protein: 6.5 g/dL (ref 6.5–8.1)

## 2018-01-20 LAB — LIPASE, BLOOD: LIPASE: 40 U/L (ref 11–51)

## 2018-01-20 MED ORDER — DICYCLOMINE HCL 10 MG PO CAPS
20.0000 mg | ORAL_CAPSULE | Freq: Once | ORAL | Status: AC
  Administered 2018-01-21: 20 mg via ORAL
  Filled 2018-01-20: qty 2

## 2018-01-20 NOTE — ED Triage Notes (Signed)
Pt states diarrhea X 3 since this morning with abdominal cramping to lower abdomen. 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

## 2018-01-20 NOTE — ED Provider Notes (Signed)
[facility] Provider Note   CSN: [id number] Arrival date & time: 01/20/18  1630     History   Chief Complaint Chief Complaint  Patient presents with  . Diarrhea    HPI Stephen Odom is a 42 y.o. male.  The history is provided by the patient and medical records.  Diarrhea   Associated symptoms include abdominal pain (cramping).     42 year old male with history of HIV with last CD4 count 840, shingles, presenting to the ED with diarrhea.  Patient reports today he had some lower abdominal cramping and 3 episodes of watery, nonbloody diarrhea.  States coworker earlier in the week was sick with similar symptoms.  He denies any recent travel, abnormal food intake, fever, chills, nausea, or vomiting.  Has been able to eat and drink today.  States he had some ginger ale which seemed to help his symptoms quite a bit.  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  Past Medical History:  Diagnosis Date  . HIV infection (HCC) Dx 2015  . Shingles     Patient Active Problem List   Diagnosis Date Noted  . Cigarette smoker 05/24/2016  . Urinary frequency 05/24/2016  . Screening for STD (sexually transmitted disease) 02/28/2015  . Shingles 02/28/2015  . HIV positive (HCC) 12/31/2013    No past surgical history on file.      Home Medications    Prior to Admission medications   Medication Sig Start Date End Date Taking? Authorizing Provider  cyclobenzaprine (FLEXERIL) 10 MG tablet Take 1 tablet (10 mg total) by mouth 2 (two) times daily as needed for muscle spasms. 01/01/18   Janne NapoleonNeese, Hope M, NP  GENVOYA 150-150-200-10 MG TABS tablet TAKE 1 TABLET BY MOUTH DAILY WITH BREAKFAST 07/29/17   Cliffton Astersampbell, [name], MD  ibuprofen (ADVIL,MOTRIN) 600 MG tablet Take 1 tablet (600 mg total) by mouth every 8 (eight) hours as needed. Patient not taking: Reported on 05/24/2016 02/28/15   Dessa PhiFunches, Josalyn, MD  levofloxacin  (LEVAQUIN) 500 MG tablet Take 1 tablet (500 mg total) by mouth daily. 08/29/17   Kirichenko, Tatyana, PA-C  ondansetron (ZOFRAN ODT) 8 MG disintegrating tablet Take 1 tablet (8 mg total) by mouth every 8 (eight) hours as needed for nausea or vomiting. 08/29/17   Jaynie CrumbleKirichenko, Tatyana, PA-C    Family History Family History  Problem Relation Age of Onset  . Diabetes Mother   . Hypertension Mother   . Cancer Maternal Uncle        lung, stomach   . Diabetes Maternal Grandmother   . Diabetes Father   . Hypertension Father     Social History Social History   Tobacco Use  . Smoking status: Current Some Day Smoker    Packs/day: 0.30    Years: 21.00    Pack years: 6.30    Types: Cigarettes    Last attempt to quit: 01/28/2015    Years since quitting: 2.9  . Smokeless tobacco: Never Used  Substance Use Topics  . Alcohol use: Yes    Alcohol/week: 14.4 oz    Types: 24 Standard drinks or equivalent per week    Comment: weekends only  . Drug use: Yes    Frequency: 7.0 times per week    Types: Marijuana     Allergies   Zovirax [acyclovir]   Review of Systems Review of Systems  Gastrointestinal: Positive for abdominal pain (cramping) and diarrhea.  All other systems reviewed  and are negative.    Physical Exam Updated Vital Signs BP (!) 133/97 (BP Location: Right Arm)   Pulse 68   Temp 98 F (36.7 C) (Oral)   Resp 18   Ht 5\' 7"  (1.702 m)   Wt 68 kg (150 lb)   SpO2 100%   BMI 23.49 kg/m   Physical Exam  Constitutional: He is oriented to person, place, and time. He appears well-developed and well-nourished.  HENT:  Head: Normocephalic and atraumatic.  Mouth/Throat: Oropharynx is clear and moist.  Eyes: Pupils are equal, round, and reactive to light. Conjunctivae and EOM are normal.  Neck: Normal range of motion.  Cardiovascular: Normal rate, regular rhythm and normal heart sounds.  Pulmonary/Chest: Effort normal and breath sounds normal. No stridor. No respiratory distress.   Abdominal: Soft. Bowel sounds are normal. There is no tenderness. There is no rebound.  Musculoskeletal: Normal range of motion.  Neurological: He is alert and oriented to person, place, and time.  Skin: Skin is warm and dry.  Psychiatric: He has a normal mood and affect.  Nursing note and vitals reviewed.    ED Treatments / Results  Labs (all labs ordered are listed, but only abnormal results are displayed) Labs Reviewed  COMPREHENSIVE METABOLIC PANEL - Abnormal; Notable for the following components:      Result Value   Glucose, Bld 147 (*)    Calcium 8.6 (*)    AST 13 (*)    All other components within normal limits  LIPASE, BLOOD  CBC    EKG None  Radiology No results found.  Procedures Procedures (including critical care time)  Medications Ordered in ED Medications  dicyclomine (BENTYL) capsule 20 mg (20 mg Oral Given 01/21/18 0011)     Initial Impression / Assessment and Plan / ED Course  I have reviewed the triage vital signs and the nursing notes.  Pertinent labs & imaging results that were available during my care of the patient were reviewed by me and considered in my medical decision making (see chart for details).  42 year old male presenting to the ED with diarrhea.  Coworker sick with similar symptoms earlier in the week.  He is afebrile and nontoxic.  Does have history of HIV but is immunocompetent based on last CD4 count of 840.  His abdomen is soft and benign.  Labs overall reassuring.  Suspect viral etiology.  Treated here with Bentyl.  Tolerating oral fluids and sandwich here without issue.  Feel he is stable for discharge.  Recommended brat diet, continue Bentyl at home to help with cramping.  Work note given.  Close follow-up with PCP.  Discussed plan with patient, he acknowledged understanding and agreed with plan of care.  Return precautions given for new or worsening symptoms.  Final Clinical Impressions(s) / ED Diagnoses   Final diagnoses:    Diarrhea, unspecified type  Abdominal cramping    ED Discharge Orders        Ordered    dicyclomine (BENTYL) 20 MG tablet  2 times daily     01/21/18 0022       Garlon Hatchet, PA-C 01/21/18 0134    Loren Racer, MD 01/24/18 1520

## 2018-01-21 MED ORDER — DICYCLOMINE HCL 20 MG PO TABS: 20.0000 mg | ORAL_TABLET | Freq: Two times a day (BID) | ORAL | 0 refills | Status: DC

## 2018-01-21 NOTE — Discharge Instructions (Signed)
Take the prescribed medication as directed. May help to follow gentle diet and progress back to normal as tolerated. Follow-up with your primary care doctor. Return to the ED for new or worsening symptoms.

## 2018-03-07 ENCOUNTER — Other Ambulatory Visit: Payer: Self-pay | Admitting: Internal Medicine

## 2018-03-07 DIAGNOSIS — B20 Human immunodeficiency virus [HIV] disease: Secondary | ICD-10-CM

## 2018-03-22 ENCOUNTER — Other Ambulatory Visit: Payer: Self-pay

## 2018-03-23 ENCOUNTER — Other Ambulatory Visit: Payer: Self-pay

## 2018-04-10 ENCOUNTER — Other Ambulatory Visit: Payer: Self-pay | Admitting: Internal Medicine

## 2018-04-10 DIAGNOSIS — B20 Human immunodeficiency virus [HIV] disease: Secondary | ICD-10-CM

## 2018-04-12 ENCOUNTER — Ambulatory Visit: Payer: Self-pay | Admitting: Internal Medicine

## 2018-05-07 ENCOUNTER — Other Ambulatory Visit: Payer: Self-pay

## 2018-05-07 ENCOUNTER — Encounter (HOSPITAL_COMMUNITY): Payer: Self-pay | Admitting: Emergency Medicine

## 2018-05-07 ENCOUNTER — Emergency Department (HOSPITAL_COMMUNITY): Payer: Self-pay

## 2018-05-07 ENCOUNTER — Emergency Department (HOSPITAL_COMMUNITY)
Admission: EM | Admit: 2018-05-07 | Discharge: 2018-05-07 | Disposition: A | Discharge status: Home / Self Care | Payer: Self-pay | Attending: Emergency Medicine | Admitting: Emergency Medicine

## 2018-05-07 DIAGNOSIS — Y999 Unspecified external cause status: Secondary | ICD-10-CM | POA: Insufficient documentation

## 2018-05-07 DIAGNOSIS — T7611XA Adult physical abuse, suspected, initial encounter: Secondary | ICD-10-CM | POA: Insufficient documentation

## 2018-05-07 DIAGNOSIS — S0003XA Contusion of scalp, initial encounter: Secondary | ICD-10-CM

## 2018-05-07 DIAGNOSIS — S0101XA Laceration without foreign body of scalp, initial encounter: Secondary | ICD-10-CM | POA: Insufficient documentation

## 2018-05-07 DIAGNOSIS — Y9289 Other specified places as the place of occurrence of the external cause: Secondary | ICD-10-CM | POA: Insufficient documentation

## 2018-05-07 DIAGNOSIS — W228XXA Striking against or struck by other objects, initial encounter: Secondary | ICD-10-CM | POA: Insufficient documentation

## 2018-05-07 DIAGNOSIS — F17210 Nicotine dependence, cigarettes, uncomplicated: Secondary | ICD-10-CM | POA: Insufficient documentation

## 2018-05-07 DIAGNOSIS — Z23 Encounter for immunization: Secondary | ICD-10-CM | POA: Insufficient documentation

## 2018-05-07 DIAGNOSIS — B20 Human immunodeficiency virus [HIV] disease: Secondary | ICD-10-CM | POA: Insufficient documentation

## 2018-05-07 DIAGNOSIS — Y939 Activity, unspecified: Secondary | ICD-10-CM | POA: Insufficient documentation

## 2018-05-07 DIAGNOSIS — Z79899 Other long term (current) drug therapy: Secondary | ICD-10-CM | POA: Insufficient documentation

## 2018-05-07 MED ORDER — TETANUS-DIPHTH-ACELL PERTUSSIS 5-2.5-18.5 LF-MCG/0.5 IM SUSP
0.5000 mL | Freq: Once | INTRAMUSCULAR | Status: AC
  Administered 2018-05-07: 0.5 mL via INTRAMUSCULAR
  Filled 2018-05-07: qty 0.5

## 2018-05-07 MED ORDER — ACETAMINOPHEN 500 MG PO TABS
1000.0000 mg | ORAL_TABLET | Freq: Once | ORAL | Status: AC
  Administered 2018-05-07: 1000 mg via ORAL
  Filled 2018-05-07: qty 2
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
## 2018-05-07 NOTE — ED Notes (Signed)
Reviewed d/c instructions with pt, who verbalized understanding and had no outstanding questions. Pt departed in NAD, refused use of wheelchair.   
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
## 2018-05-07 NOTE — Discharge Instructions (Signed)
We recommend that you apply bacitracin or Neosporin over top of your wound.  Do not soak your head in water such as while bathing or swimming.  You may continue use of Tylenol every 6-8 hours as needed for management of headache.  Return to the emergency department or follow-up with your primary care doctor in 1 week to have your staples removed.

## 2018-05-07 NOTE — ED Notes (Signed)
Patient transported to CT 

## 2018-05-12 ENCOUNTER — Other Ambulatory Visit: Payer: Self-pay | Admitting: Internal Medicine

## 2018-05-12 DIAGNOSIS — B20 Human immunodeficiency virus [HIV] disease: Secondary | ICD-10-CM

## 2018-05-17 ENCOUNTER — Other Ambulatory Visit: Payer: Self-pay

## 2018-05-17 ENCOUNTER — Ambulatory Visit: Payer: Self-pay

## 2018-05-17 ENCOUNTER — Telehealth: Payer: Self-pay | Admitting: Pharmacist

## 2018-05-17 DIAGNOSIS — Z21 Asymptomatic human immunodeficiency virus [HIV] infection status: Secondary | ICD-10-CM

## 2018-05-17 DIAGNOSIS — B20 Human immunodeficiency virus [HIV] disease: Secondary | ICD-10-CM

## 2018-05-17 MED ORDER — ELVITEG-COBIC-EMTRICIT-TENOFAF 150-150-200-10 MG PO TABS: 1.0000 | ORAL_TABLET | Freq: Every day | ORAL | 2 refills | Status: DC

## 2018-05-17 NOTE — Telephone Encounter (Signed)
Patient came in to renew ADAP.  He has ~1 week left of Genvoya.  I was able to get an immediate 30 day approval from [employer] to last him until his ADAP is renewed. He will fill at [facility] for now and transition to [facility] when ADAP is approved again.

## 2018-05-18 LAB — T-HELPER CELL (CD4) - (RCID CLINIC ONLY)
CD4 % Helper T Cell: 34 % (ref 33–55)
CD4 T Cell Abs: 520 /uL (ref 400–2700)

## 2018-05-18 MED FILL — GENVOYA TABLET: 150-150-200 | 30 days supply | Qty: 30 | Fill #0

## 2018-05-19 LAB — HIV-1 RNA QUANT-NO REFLEX-BLD
HIV 1 RNA QUANT: 24 {copies}/mL — AB
HIV-1 RNA QUANT, LOG: 1.38 {Log_copies}/mL — AB

## 2018-05-29 ENCOUNTER — Ambulatory Visit: Payer: Self-pay | Admitting: Internal Medicine

## 2018-07-03 ENCOUNTER — Other Ambulatory Visit: Payer: Self-pay | Admitting: Pharmacist

## 2018-07-03 DIAGNOSIS — B20 Human immunodeficiency virus [HIV] disease: Secondary | ICD-10-CM

## 2018-07-03 MED ORDER — ELVITEG-COBIC-EMTRICIT-TENOFAF 150-150-200-10 MG PO TABS: 1.0000 | ORAL_TABLET | Freq: Every day | ORAL | 1 refills | Status: DC

## 2018-07-03 NOTE — Progress Notes (Signed)
[facility]

## 2018-07-30 ENCOUNTER — Emergency Department (HOSPITAL_COMMUNITY)
Admission: EM | Admit: 2018-07-30 | Discharge: 2018-07-30 | Disposition: A | Discharge status: Home / Self Care | Payer: Self-pay | Attending: Emergency Medicine | Admitting: Emergency Medicine

## 2018-07-30 ENCOUNTER — Emergency Department (HOSPITAL_COMMUNITY): Payer: Self-pay

## 2018-07-30 ENCOUNTER — Encounter (HOSPITAL_COMMUNITY): Payer: Self-pay | Admitting: Emergency Medicine

## 2018-07-30 DIAGNOSIS — J069 Acute upper respiratory infection, unspecified: Secondary | ICD-10-CM | POA: Insufficient documentation

## 2018-07-30 DIAGNOSIS — F17210 Nicotine dependence, cigarettes, uncomplicated: Secondary | ICD-10-CM | POA: Insufficient documentation

## 2018-07-30 DIAGNOSIS — B20 Human immunodeficiency virus [HIV] disease: Secondary | ICD-10-CM | POA: Insufficient documentation

## 2018-07-30 LAB — BASIC METABOLIC PANEL
Anion gap: 8 (ref 5–15)
BUN: 16 mg/dL (ref 6–20)
CO2: 25 mmol/L (ref 22–32)
Calcium: 8.7 mg/dL — ABNORMAL LOW (ref 8.9–10.3)
Chloride: 102 mmol/L (ref 98–111)
Creatinine, Ser: 0.84 mg/dL (ref 0.61–1.24)
GFR calc Af Amer: >60 mL/min (ref >60)
GFR calc non Af Amer: >60 mL/min (ref >60)
Glucose, Bld: 98 mg/dL (ref 70–99)
Potassium: 4.2 mmol/L (ref 3.5–5.1)
Sodium: 135 mmol/L (ref 135–145)

## 2018-07-30 LAB — CBC WITH DIFFERENTIAL/PLATELET
Abs Immature Granulocytes: 0 10*3/uL (ref 0.00–0.07)
Basophils Absolute: 0 10*3/uL (ref 0.0–0.1)
Basophils Relative: 1 %
Eosinophils Absolute: 0.1 10*3/uL (ref 0.0–0.5)
Eosinophils Relative: 3 %
HCT: 43.8 % (ref 39.0–52.0)
Hemoglobin: 14.2 g/dL (ref 13.0–17.0)
Immature Granulocytes: 0 %
Lymphocytes Relative: 45 %
Lymphs Abs: 1.8 10*3/uL (ref 0.7–4.0)
MCH: 28.2 pg (ref 26.0–34.0)
MCHC: 32.4 g/dL (ref 30.0–36.0)
MCV: 87.1 fL (ref 80.0–100.0)
Monocytes Absolute: 0.5 10*3/uL (ref 0.1–1.0)
Monocytes Relative: 13 %
Neutro Abs: 1.5 10*3/uL — ABNORMAL LOW (ref 1.7–7.7)
Neutrophils Relative %: 38 %
Platelets: 284 10*3/uL (ref 150–400)
RBC: 5.03 MIL/uL (ref 4.22–5.81)
RDW: 12.2 % (ref 11.5–15.5)
WBC: 3.9 10*3/uL — ABNORMAL LOW (ref 4.0–10.5)
nRBC: 0 % (ref 0.0–0.2)

## 2018-07-30 MED ORDER — SALINE SPRAY 0.65 % NA SOLN: 1.0000 | NASAL | 0 refills | Status: DC | PRN

## 2018-07-30 MED ORDER — BENZONATATE 100 MG PO CAPS: 100.0000 mg | ORAL_CAPSULE | Freq: Three times a day (TID) | ORAL | 0 refills | Status: DC | PRN

## 2018-07-30 NOTE — ED Provider Notes (Signed)
[facility]GENC[facility] Provider Note   CSN: [id number] Arrival date & time: 07/30/18  1935     History   Chief Complaint Chief Complaint  Patient presents with  . Influenza    HPI Stephen Odom is a 43 y.o. male with history of HIV disease (last viral load obtained 05/17/2018 was detectable, CD4 count 520) presents for evaluation of acute onset, persistent flulike symptoms for 5 days.  He reports nasal congestion and sinus pressure, cough productive of yellow sputum, pain to the chest with cough.  Complains of intermittent shortness of breath, worsens with ambulation.  Denies neck stiffness, abdominal pain, nausea, vomiting.  Has been taking Aleve and drinking fluids with some relief.  Has had a sick contact at home.  Smokes approximately a pack of cigarettes daily.  He does note that he skipped 2 doses of his antiretroviral therapy this week.  The history is provided by the patient.    Past Medical History:  Diagnosis Date  . HIV infection (HCC) Dx 2015  . Shingles     Patient Active Problem List   Diagnosis Date Noted  . Cigarette smoker 05/24/2016  . Urinary frequency 05/24/2016  . Screening for STD (sexually transmitted disease) 02/28/2015  . Shingles 02/28/2015  . HIV positive (HCC) 12/31/2013    History reviewed. No pertinent surgical history.      Home Medications    Prior to Admission medications   Medication Sig Start Date End Date Taking? Authorizing Provider  elvitegravir-cobicistat-emtricitabine-tenofovir (GENVOYA) 150-150-200-10 MG TABS tablet Take 1 tablet by mouth daily with breakfast. Patient taking differently: Take 1 tablet by mouth daily after lunch.  07/03/18  Yes Cliffton Astersampbell, [name], MD  naproxen sodium (ALEVE) 220 MG tablet Take 660 mg by mouth daily as needed (headache).   Yes [provider]  benzonatate (TESSALON) 100 MG capsule Take 1 capsule (100 mg total) by mouth 3 (three) times daily as needed for cough.  07/30/18   Yoshie Kosel A, PA-C  cyclobenzaprine (FLEXERIL) 10 MG tablet Take 1 tablet (10 mg total) by mouth 2 (two) times daily as needed for muscle spasms. Patient not taking: Reported on 07/30/2018 01/01/18   Janne NapoleonNeese, Hope M, NP  dicyclomine (BENTYL) 20 MG tablet Take 1 tablet (20 mg total) by mouth 2 (two) times daily. Patient not taking: Reported on 07/30/2018 01/21/18   Garlon HatchetSanders, Lisa M, PA-C  ibuprofen (ADVIL,MOTRIN) 600 MG tablet Take 1 tablet (600 mg total) by mouth every 8 (eight) hours as needed. Patient not taking: Reported on 05/24/2016 02/28/15   Dessa PhiFunches, Josalyn, MD  ondansetron (ZOFRAN ODT) 8 MG disintegrating tablet Take 1 tablet (8 mg total) by mouth every 8 (eight) hours as needed for nausea or vomiting. Patient not taking: Reported on 07/30/2018 08/29/17   Jaynie CrumbleKirichenko, Tatyana, PA-C  sodium chloride (OCEAN) 0.65 % SOLN nasal spray Place 1 spray into both nostrils as needed for congestion. 07/30/18   Jeanie SewerFawze, Vladimir Lenhoff A, PA-C    Family History Family History  Problem Relation Age of Onset  . Diabetes Mother   . Hypertension Mother   . Cancer Maternal Uncle        lung, stomach   . Diabetes Maternal Grandmother   . Diabetes Father   . Hypertension Father     Social History Social History   Tobacco Use  . Smoking status: Current Some Day Smoker    Packs/day: 0.30    Years: 21.00    Pack years: 6.30    Types: Cigarettes  .  Smokeless tobacco: Never Used  Substance Use Topics  . Alcohol use: Yes    Alcohol/week: 24.0 standard drinks    Types: 24 Standard drinks or equivalent per week    Comment: weekends only  . Drug use: Yes    Frequency: 7.0 times per week    Types: Marijuana     Allergies   Zovirax [acyclovir]   Review of Systems Review of Systems  Constitutional: Negative for chills and fever.  HENT: Positive for congestion and sinus pressure. Negative for sore throat.   Respiratory: Positive for cough and shortness of breath.   Cardiovascular: Positive for chest pain.    Gastrointestinal: Negative for abdominal pain, nausea and vomiting.  Musculoskeletal: Negative for neck stiffness.  All other systems reviewed and are negative.    Physical Exam Updated Vital Signs BP (!) 144/98   Pulse 69   Temp 98.2 F (36.8 C) (Oral)   Resp 18   Ht 5\' 7"  (1.702 m)   Wt 68 kg   SpO2 100%   BMI 23.49 kg/m   Physical Exam Vitals signs and nursing note reviewed.  Constitutional:      General: He is not in acute distress.    Appearance: He is well-developed.  HENT:     Head: Normocephalic and atraumatic.     Right Ear: Tympanic membrane, ear canal and external ear normal.     Left Ear: Ear canal and external ear normal.     Nose: Congestion present.     Mouth/Throat:     Mouth: Mucous membranes are moist.     Pharynx: No posterior oropharyngeal erythema.     Comments: No tonsillar hypertrophy, exudates, erythema, trismus, or sublingual abnormalities.  Tolerating secretions without difficulty.  No uvular deviation. Eyes:     General:        Right eye: No discharge.        Left eye: No discharge.     Extraocular Movements: Extraocular movements intact.     Conjunctiva/sclera: Conjunctivae normal.     Pupils: Pupils are equal, round, and reactive to light.  Neck:     Musculoskeletal: Normal range of motion and neck supple. No neck rigidity.     Vascular: No JVD.     Trachea: No tracheal deviation.  Cardiovascular:     Rate and Rhythm: Normal rate.     Pulses: Normal pulses.     Heart sounds: Normal heart sounds.  Pulmonary:     Effort: Pulmonary effort is normal.     Breath sounds: Normal breath sounds.     Comments: Speaking in full sentences without difficulty Abdominal:     General: There is no distension.     Tenderness: There is no abdominal tenderness. There is no guarding or rebound.  Lymphadenopathy:     Cervical: No cervical adenopathy.  Skin:    General: Skin is warm and dry.     Findings: No erythema.  Neurological:     Mental  Status: He is alert.  Psychiatric:        Behavior: Behavior normal.      ED Treatments / Results  Labs (all labs ordered are listed, but only abnormal results are displayed) Labs Reviewed  BASIC METABOLIC PANEL - Abnormal; Notable for the following components:      Result Value   Calcium 8.7 (*)    All other components within normal limits  CBC WITH DIFFERENTIAL/PLATELET - Abnormal; Notable for the following components:   WBC 3.9 (*)  Neutro Abs 1.5 (*)    All other components within normal limits    EKG EKG Interpretation  Date/Time:  "Sunday July 30 2018 19:44:38 EST Ventricular Rate:  68 PR Interval:    QRS Duration: 81 QT Interval:  362 QTC Calculation: 385 R Axis:   84 Text Interpretation:  Sinus rhythm Anteroseptal infarct, old No old tracing to compare Confirmed by Knapp, Jon ([id number]) on 07/30/2018 8:05:29 PM   Radiology Dg Chest 2 View  Result Date: 07/30/2018 CLINICAL DATA:  Cough and chills. EXAM: CHEST - 2 VIEW COMPARISON:  Radiographs 08/29/2017 FINDINGS: Previous reticular opacities in the mid lung zones have resolved.The cardiomediastinal contours are normal. The lungs are clear. Pulmonary vasculature is normal. No consolidation, pleural effusion, or pneumothorax. No acute osseous abnormalities are seen. IMPRESSION: No acute chest findings. Electronically Signed   By: Melanie  Sanford M.D.   On: 07/30/2018 20:49    Procedures Procedures (including critical care time)  Medications Ordered in ED Medications - No data to display   Initial Impression / Assessment and Plan / ED Course  I have reviewed the triage vital signs and the nursing notes.  Pertinent labs & imaging results that were available during my care of the patient were reviewed by me and considered in my medical decision making (see chart for details).     Patient with history of HIV with detectable viral load but normal CD4 count presents for evaluation of 5-day history of flulike  symptoms.  He is afebrile, vital signs are stable.  He is nontoxic in appearance.  Chest x-ray shows no acute cardiopulmonary abnormalities.  No evidence of pleural effusion or pneumonia.  EKG shows normal sinus rhythm.  Chest pain does not sound cardiac in nature, more so sounds related to persistent cough.  He has no leukocytosis, no anemia, no metabolic derangements on review of lab work performed in the ED.  No evidence of peritonsillar abscess, retropharyngeal abscess, Ludwig's angina, meningitis, or empyema.  Given he is overall well-appearing suspect he has a viral URI.  He is aware that he is out of the window for Tamiflu even if he does have influenza.  Will discharge with symptomatic management.  Recommend follow-up with PCP for reevaluation of symptoms.  Discussed strict ED return precautions. Pt verbalized understanding of and agreement with plan and is safe for discharge home at this time.   Final Clinical Impressions(s) / ED Diagnoses   Final diagnoses:  URI with cough and congestion    ED Discharge Orders         Ordered    benzonatate (TESSALON) 100 MG capsule  3 times daily PRN     07/30/18 2134    sodium chloride (OCEAN) 0.65 % SOLN nasal spray  As needed     01" /05/20 2134           Jeanie Sewer, PA-C 07/30/18 2206    Linwood Dibbles, MD 07/31/18 1233

## 2018-07-30 NOTE — ED Triage Notes (Signed)
Pt c/o cough with yellow phlegm,  sore nasal passages, shob, fever per pt (has not measured), chest discomfort with breathing. X 5 days.  Denies n/v/d.

## 2018-07-30 NOTE — Discharge Instructions (Addendum)
Continue to stay well-hydrated. Gargle warm salt water and spit it out for sore throat. May also use cough drops, warm teas, etc. Use nasal saline to decrease nasal congestion.  Tessalon as needed for cough.  You can take over-the-counter medicine such as Robitussin, Mucinex, or even allergy medicine such as Zyrtec or Allegra for nasal congestion and scratchy throat. Alternate 600 mg of ibuprofen and [id number] mg of Tylenol every 3 hours as needed for pain. Do not exceed 4000 mg of Tylenol daily.   Followup with your primary care doctor in 5-7 days for recheck of ongoing symptoms. Return to emergency department for emergent changing or worsening of symptoms such as throat tightness, facial swelling, fever not controlled by ibuprofen or Tylenol,difficulty breathing, or chest pain.

## 2018-07-30 NOTE — ED Notes (Signed)
Pt ambulated with pulse ox, sats remained 100% entire time, HR 60's. Pt reports feeling SOB but did not appear to be during ambulation, NAD noted.

## 2018-07-30 NOTE — ED Notes (Signed)
Pt returned from xray

## 2018-09-21 ENCOUNTER — Ambulatory Visit: Payer: Self-pay

## 2018-10-02 ENCOUNTER — Other Ambulatory Visit: Payer: Self-pay | Admitting: Internal Medicine

## 2018-10-02 DIAGNOSIS — B20 Human immunodeficiency virus [HIV] disease: Secondary | ICD-10-CM

## 2018-10-03 ENCOUNTER — Telehealth: Payer: Self-pay

## 2018-10-03 NOTE — Telephone Encounter (Signed)
Patient called office requesting refills for Genvoya. Patient states that he has been our of medication for one week. Informed patient that since we have not seen him since 07/2017 we are unable to send refills until he is seen with our office. Patient's call was transferred to scheduling for appointment.  Patient will see Cassie, Pharmacist on 3/11. Lorenso Courier, [place]

## 2018-10-04 ENCOUNTER — Encounter: Payer: Self-pay | Admitting: Internal Medicine

## 2018-10-04 ENCOUNTER — Other Ambulatory Visit: Payer: Self-pay

## 2018-10-04 ENCOUNTER — Ambulatory Visit (INDEPENDENT_AMBULATORY_CARE_PROVIDER_SITE_OTHER): Payer: Self-pay | Admitting: Pharmacist

## 2018-10-04 ENCOUNTER — Other Ambulatory Visit (HOSPITAL_COMMUNITY): Admission: RE | Admit: 2018-10-04 | Payer: Self-pay | Source: Ambulatory Visit | Admitting: Internal Medicine

## 2018-10-04 ENCOUNTER — Ambulatory Visit: Payer: Self-pay

## 2018-10-04 DIAGNOSIS — B20 Human immunodeficiency virus [HIV] disease: Secondary | ICD-10-CM

## 2018-10-04 MED ORDER — ELVITEG-COBIC-EMTRICIT-TENOFAF 150-150-200-10 MG PO TABS: 1.0000 | ORAL_TABLET | Freq: Every day | ORAL | 5 refills | Status: DC

## 2018-10-05 LAB — URINE CYTOLOGY ANCILLARY ONLY
CHLAMYDIA, DNA PROBE: NEGATIVE
Neisseria Gonorrhea: NEGATIVE

## 2018-10-05 LAB — T-HELPER CELL (CD4) - (RCID CLINIC ONLY)
CD4 % Helper T Cell: 29 % — ABNORMAL LOW (ref 33–55)
CD4 T Cell Abs: 560 /uL (ref 400–2700)

## 2018-10-05 NOTE — Progress Notes (Signed)
HPI: Stephen Odom is a 43 y.o. male who presents to the [facility] pharmacy clinic for HIV follow-up. Patient called 10/03/2018 for Genvoya refills, and reported had been out of Genvoya for about 1 week.  Patient Active Problem List   Diagnosis Date Noted  . Cigarette smoker 05/24/2016  . Urinary frequency 05/24/2016  . Screening for STD (sexually transmitted disease) 02/28/2015  . Shingles 02/28/2015  . HIV positive (HCC) 12/31/2013    Patient's Medications  New Prescriptions   No medications on file  Previous Medications   BENZONATATE (TESSALON) 100 MG CAPSULE    Take 1 capsule (100 mg total) by mouth 3 (three) times daily as needed for cough.   CYCLOBENZAPRINE (FLEXERIL) 10 MG TABLET    Take 1 tablet (10 mg total) by mouth 2 (two) times daily as needed for muscle spasms.   DICYCLOMINE (BENTYL) 20 MG TABLET    Take 1 tablet (20 mg total) by mouth 2 (two) times daily.   IBUPROFEN (ADVIL,MOTRIN) 600 MG TABLET    Take 1 tablet (600 mg total) by mouth every 8 (eight) hours as needed.   NAPROXEN SODIUM (ALEVE) 220 MG TABLET    Take 660 mg by mouth daily as needed (headache).   ONDANSETRON (ZOFRAN ODT) 8 MG DISINTEGRATING TABLET    Take 1 tablet (8 mg total) by mouth every 8 (eight) hours as needed for nausea or vomiting.   SODIUM CHLORIDE (OCEAN) 0.65 % SOLN NASAL SPRAY    Place 1 spray into both nostrils as needed for congestion.  Modified Medications   Modified Medication Previous Medication   ELVITEGRAVIR-COBICISTAT-EMTRICITABINE-TENOFOVIR (GENVOYA) 150-150-200-10 MG TABS TABLET elvitegravir-cobicistat-emtricitabine-tenofovir (GENVOYA) 150-150-200-10 MG TABS tablet      Take 1 tablet by mouth daily with lunch.    Take 1 tablet by mouth daily with breakfast.  Discontinued Medications   No medications on file    Allergies: Allergies  Allergen Reactions  . Zovirax [Acyclovir] Nausea And Vomiting    Past Medical History: Past Medical History:  Diagnosis Date  . HIV infection (HCC)  Dx 2015  . Shingles     Social History: Social History   Socioeconomic History  . Marital status: Single    Spouse name: Not on file  . Number of children: 0  . Years of education: 65  . Highest education level: Not on file  Occupational History  . Occupation: Nutritional therapist    Comment: [employer]   Social Needs  . Financial resource strain: Not on file  . Food insecurity:    Worry: Not on file    Inability: Not on file  . Transportation needs:    Medical: Not on file    Non-medical: Not on file  Tobacco Use  . Smoking status: Current Some Day Smoker    Packs/day: 0.30    Years: 21.00    Pack years: 6.30    Types: Cigarettes  . Smokeless tobacco: Never Used  Substance and Sexual Activity  . Alcohol use: Yes    Alcohol/week: 24.0 standard drinks    Types: 24 Standard drinks or equivalent per week    Comment: weekends only  . Drug use: Yes    Frequency: 7.0 times per week    Types: Marijuana  . Sexual activity: Not Currently    Partners: Male    Birth control/protection: Condom    Comment: given condoms  Lifestyle  . Physical activity:    Days per week: Not on file    Minutes per session: Not  on file  . Stress: Not on file  Relationships  . Social connections:    Talks on phone: Not on file    Gets together: Not on file    Attends religious service: Not on file    Active member of club or organization: Not on file    Attends meetings of clubs or organizations: Not on file    Relationship status: Not on file  Other Topics Concern  . Not on file  Social History Narrative  . Not on file    Labs: Lab Results  Component Value Date   HIV1RNAQUANT 24 (H) 05/17/2018   HIV1RNAQUANT <20 NOT DETECTED 08/08/2017   HIV1RNAQUANT <20 DETECTED (A) 03/23/2017   CD4TABS 520 05/17/2018   CD4TABS 840 08/08/2017   CD4TABS 590 03/23/2017    RPR and STI Lab Results  Component Value Date   LABRPR NON-REACTIVE 10/04/2018   LABRPR NON-REACTIVE 08/08/2017    LABRPR NON REAC 03/23/2017   LABRPR NON REAC 02/28/2015    STI Results GC CT  02/28/2015 Negative Negative    Hepatitis B Lab Results  Component Value Date   HEPBSAB NEG 02/28/2015   HEPBSAG NEGATIVE 02/28/2015   Hepatitis C No results found for: HEPCAB, HCVRNAPCRQN Hepatitis A No results found for: HAV Lipids: Lab Results  Component Value Date   CHOL 176 03/23/2017   TRIG 128 03/23/2017   HDL 84 03/23/2017   CHOLHDL 2.1 03/23/2017   VLDL 26 03/23/2017   LDLCALC 66 03/23/2017    Current HIV Regimen: Genvoya  Assessment: Stephen Odom is a 43 y.o. male who presents to the [facility] clinic for HIV follow-up, he has been virally suppressed since 01/2016, and last CD4 count > 500 demonstrates efficacious response to therapy.  Patient called 10/03/2018 for Genvoya refills, and reported had been out of Genvoya for about 1 week. Discussed reasons for nonadherence.  Patient stated he ran out because the pharmacy wouldn't refill until he saw the clinic. He had missed several clinic appointments (last seen 07/2017). He reported that he has a new job, and it has been difficult to get off work for appointments. When asked about adherence when he had his medications, he reported that he missed ~ 3-4 doses/month.  He took his medications at night, and reported that he tended to miss doses when he was out of the house, or when he was drinking alcohol.  He plans on taking his medication at lunch once he gets his refill.   Patient reported no new medications, or medical conditions since his last clinic visit in January 2019. Pharmacist review of his medications revealed no drug-drug interactions.  He reported new new sexual partners.  Review of labs showed CrCL, LFTs, CBC WNL.  Patient reported no side effects from the medication.  Plan: - Labs 3/12: HIV RNA, CD4 ct, CBC, CMP, RPR  - New prescription for Genvoya was sent in to pharmacy - Patient was provided a keychain pillbox -  Follow-up appointment with Dr. Orvan Falconer 11/23/2018  Lenora Boys, PharmD Candidate, [employer] [facility] 10/05/2018, 10:53 AM

## 2018-10-13 ENCOUNTER — Telehealth: Payer: Self-pay | Admitting: Pharmacist

## 2018-10-13 DIAGNOSIS — B20 Human immunodeficiency virus [HIV] disease: Secondary | ICD-10-CM

## 2018-10-13 LAB — COMPREHENSIVE METABOLIC PANEL
AG Ratio: 1.6 (calc) (ref 1.0–2.5)
ALT: 13 U/L (ref 9–46)
AST: 18 U/L (ref 10–40)
Albumin: 4.5 g/dL (ref 3.6–5.1)
Alkaline phosphatase (APISO): 91 U/L (ref 36–130)
BUN: 22 mg/dL (ref 7–25)
CO2: 31 mmol/L (ref 20–32)
Calcium: 9.8 mg/dL (ref 8.6–10.3)
Chloride: 105 mmol/L (ref 98–110)
Creat: 1.12 mg/dL (ref 0.60–1.35)
Globulin: 2.8 g/dL (calc) (ref 1.9–3.7)
Glucose, Bld: 85 mg/dL (ref 65–99)
Potassium: 4.8 mmol/L (ref 3.5–5.3)
Sodium: 142 mmol/L (ref 135–146)
Total Bilirubin: 0.4 mg/dL (ref 0.2–1.2)
Total Protein: 7.3 g/dL (ref 6.1–8.1)

## 2018-10-13 LAB — CBC
HEMATOCRIT: 42.6 % (ref 38.5–50.0)
Hemoglobin: 14.3 g/dL (ref 13.2–17.1)
MCH: 28.3 pg (ref 27.0–33.0)
MCHC: 33.6 g/dL (ref 32.0–36.0)
MCV: 84.4 fL (ref 80.0–100.0)
MPV: 8.9 fL (ref 7.5–12.5)
Platelets: 287 10*3/uL (ref 140–400)
RBC: 5.05 10*6/uL (ref 4.20–5.80)
RDW: 12.7 % (ref 11.0–15.0)
WBC: 4 10*3/uL (ref 3.8–10.8)

## 2018-10-13 LAB — HIV-1 GENOTYPE: HIV-1 Genotype: DETECTED — AB

## 2018-10-13 LAB — HIV-1 INTEGRASE GENOTYPE

## 2018-10-13 LAB — RPR: RPR Ser Ql: NONREACTIVE

## 2018-10-13 LAB — HIV RNA, RTPCR W/R GT (RTI, PI,INT)
HIV 1 RNA Quant: 439 copies/mL — ABNORMAL HIGH
HIV-1 RNA Quant, Log: 2.64 Log copies/mL — ABNORMAL HIGH

## 2018-10-13 NOTE — Telephone Encounter (Signed)
Patient recently saw me after being out of care for over a year. He admitted to missing Genvoya about 3-4 times per month and being out completely for about a week. 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    Called both numbers to discuss changing him over to [place] - his mobile number went to voicemail and was a women's number so I did not leave a voicemail. His home number stated that the call could not be completed at this time. Will keep trying to connect to patient.  May call his pharmacy and stop refills which could prompt him to reach out to clinic. See resistance below.  Will continue to monitor/follow.  Cumulative HIV Genotype Data  Genotype Dates: 10/04/2018  RT Mutations   PI Mutations   Integrase Mutations  [id number]   Interpretation of Genotype Data per [employer] HIV Drug Resistance Database:  Nucleoside RTIs  Abacavir - susceptible Zidovudine - susceptible Emtricitabine - susceptible Lamivudine - susceptible Tenofovir - susceptible   Non-Nucleoside RTIs  Doravirine - susceptible Efavirenz - susceptible Etravirine - susceptible Nevirapine - susceptible Rilpivirine - susceptible   Protease Inhibitors  Atazanavir - susceptible Darunavir - susceptible Lopinavir - susceptible   Integrase Inhibitors  Bictegravir - susceptible Dolutegravir - susceptible Elvitegravir - intermediate resistance Raltegravir - low level resistance

## 2018-10-19 MED ORDER — DARUN-COBIC-EMTRICIT-TENOFAF 800-150-200-10 MG PO TABS: 1.0000 | ORAL_TABLET | Freq: Every day | ORAL | 3 refills | Status: DC

## 2018-10-19 NOTE — Addendum Note (Signed)
Addended by: Robinette Haines on: 10/19/2018 02:43 PM   Modules accepted: Orders

## 2018-10-19 NOTE — Telephone Encounter (Signed)
Finally spoke to [place] on the phone and explained his labs to him.  He is willing to switch to Symtuza.  Counseled on how to take it and any side effects he may have.  I told him to call me with any issues taking or tolerating it.  He has an appointment on 4/30 with Dr. Orvan Falconer. I told him to make sure he comes so that we can repeat labs. He verbalized understanding.
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
## 2018-11-23 ENCOUNTER — Ambulatory Visit (INDEPENDENT_AMBULATORY_CARE_PROVIDER_SITE_OTHER): Payer: Self-pay | Admitting: Internal Medicine

## 2018-11-23 ENCOUNTER — Encounter: Payer: Self-pay | Admitting: Internal Medicine

## 2018-11-23 ENCOUNTER — Other Ambulatory Visit: Payer: Self-pay

## 2018-11-23 DIAGNOSIS — Z21 Asymptomatic human immunodeficiency virus [HIV] infection status: Secondary | ICD-10-CM

## 2018-11-23 NOTE — Progress Notes (Signed)
Virtual Visit via Telephone Note  I connected with Stephen Odom on 11/23/18 at  3:30 PM EDT by telephone and verified that I am speaking with the correct person using two identifiers.  Location: Patient: Work Provider: [facility] for Infectious Disease   I discussed the limitations, risks, security and privacy concerns of performing an evaluation and management service by telephone and the availability of in person appointments. I also discussed with the patient that there may be a patient responsible charge related to this service. The patient expressed understanding and agreed to proceed.   History of Present Illness: I spoke to Stephen Odom by phone today.  He was last in for a visit here with me in January of last year.  He has changed jobs.  He admits that he was frequently missing doses of his Genvoya.  This often occurred on weekends when he would sleep late.  He came in and March and his viral load was slightly elevated at 439.  Resistance assays revealed that he had developed resistance to 1 component of Genvoya.  He was switched to [employer].  He has had no problems tolerating it and does not recall missing any doses.  He continues to smoke cigarettes and has not thought of quitting.   Observations/Objective: HIV 1 RNA Quant (copies/mL)  Date Value  10/04/2018 439 (H)  05/17/2018 24 (H)  08/08/2017 <20 NOT DETECTED   CD4 T Cell Abs (/uL)  Date Value  10/04/2018 560  05/17/2018 520  08/08/2017 840   Genotype resistance assay 10/04/2018: No mutations detected Integrase resistance assay 10/04/2018: elvitegravir resistance predicted  Assessment and Plan: I talked him at length about the importance of strict adherence with his medication.  I asked him to set his cell phone alarm to remind him to take it.  He says that he takes his Symtuza each day with lunch.  He will follow-up here for repeat lab work in 6 weeks.  Follow Up Instructions: Continue Symtuza and follow-up here in 6  weeks   I discussed the assessment and treatment plan with the patient. The patient was provided an opportunity to ask questions and all were answered. The patient agreed with the plan and demonstrated an understanding of the instructions.   The patient was advised to call back or seek an in-person evaluation if the symptoms worsen or if the condition fails to improve as anticipated.  I provided 13 minutes of non-face-to-face time during this encounter.   Cliffton Asters, MD

## 2018-12-06 ENCOUNTER — Other Ambulatory Visit: Payer: Self-pay

## 2018-12-11 ENCOUNTER — Other Ambulatory Visit: Payer: Self-pay

## 2018-12-19 ENCOUNTER — Other Ambulatory Visit: Payer: Self-pay

## 2019-01-19 IMAGING — CR DG CHEST 2V
2 series · 2 of 2 positions shown · non-contrast
Comparison: Radiographs 08/29/2017

CLINICAL DATA: Cough and chills.

EXAM:
CHEST - 2 VIEW

[chest pa]
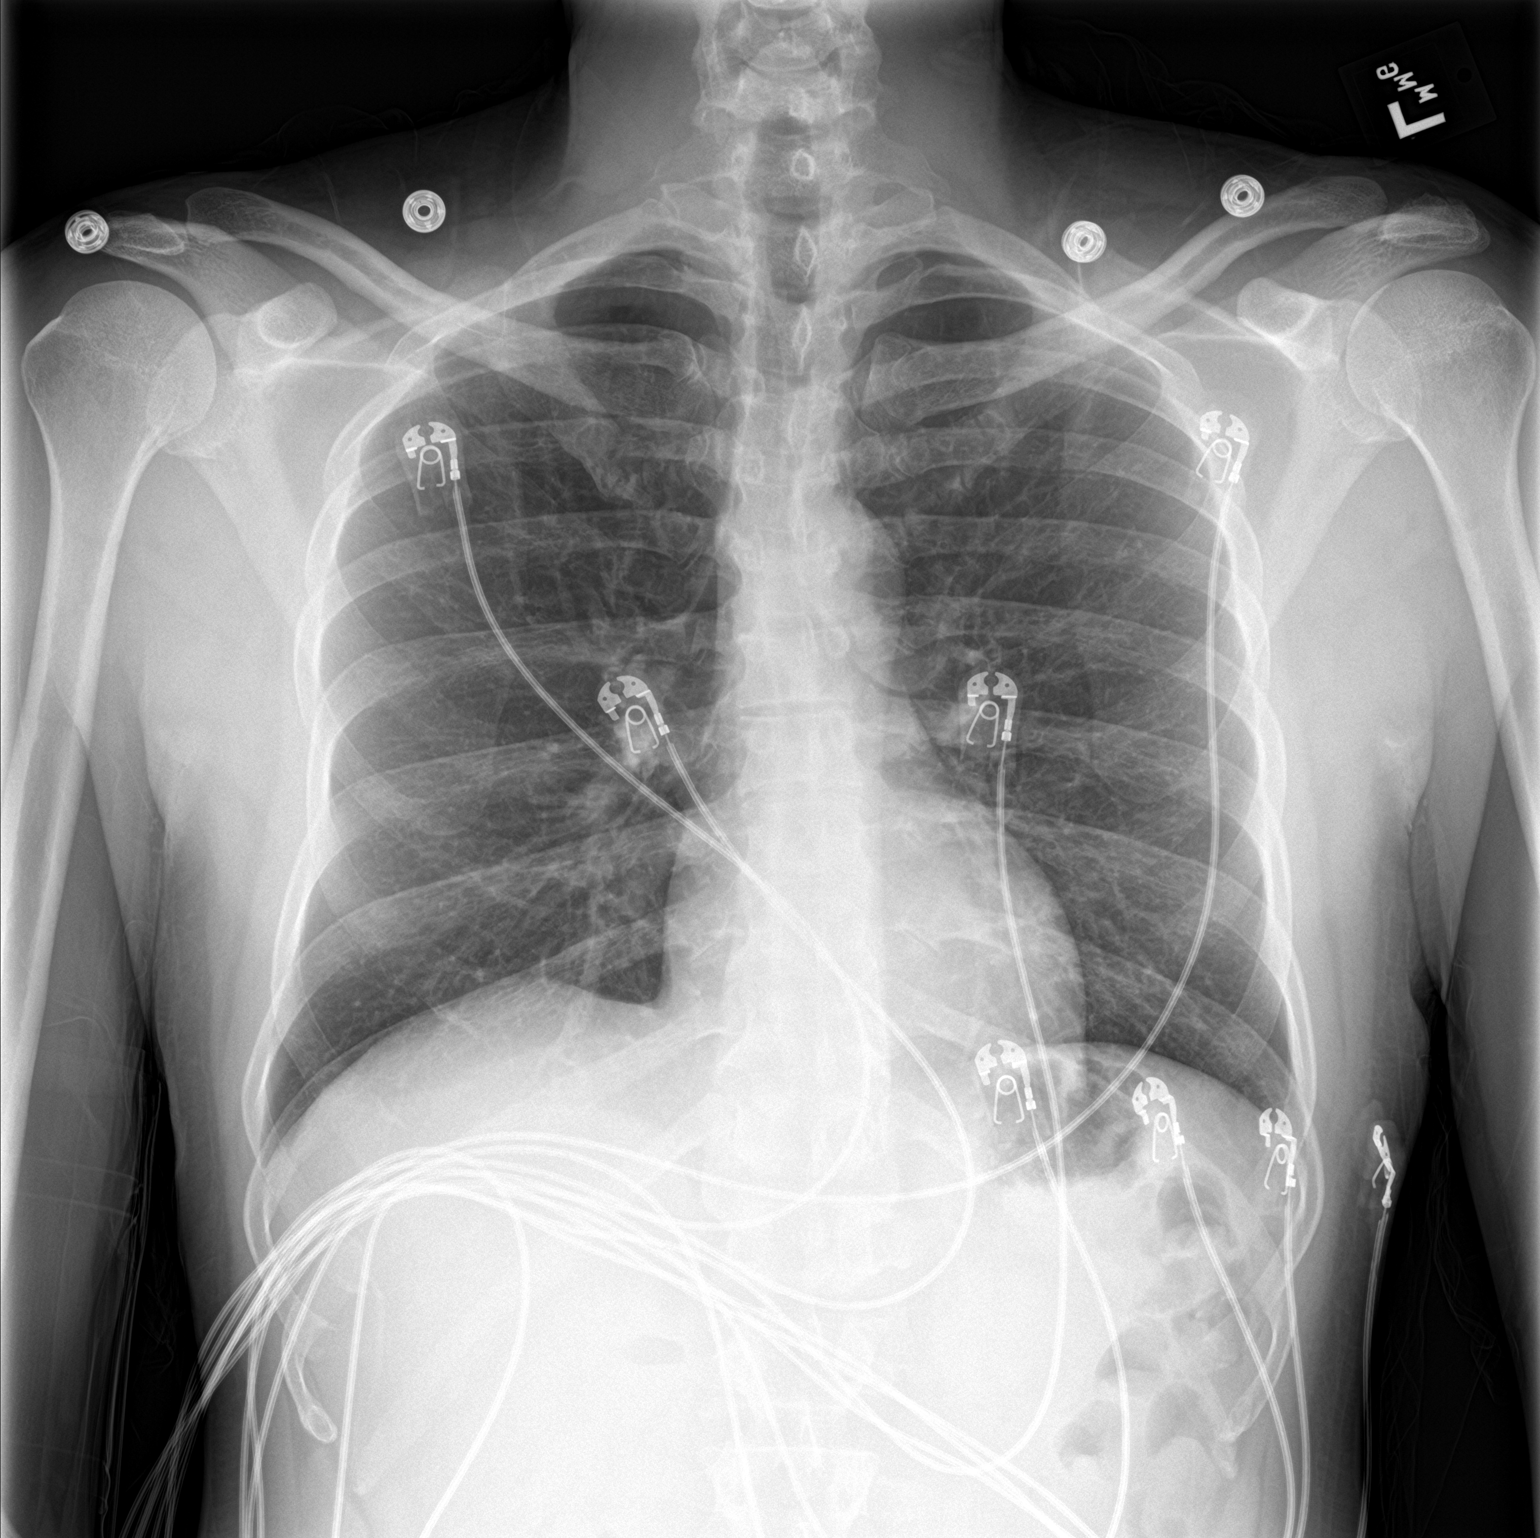

[chest lat]
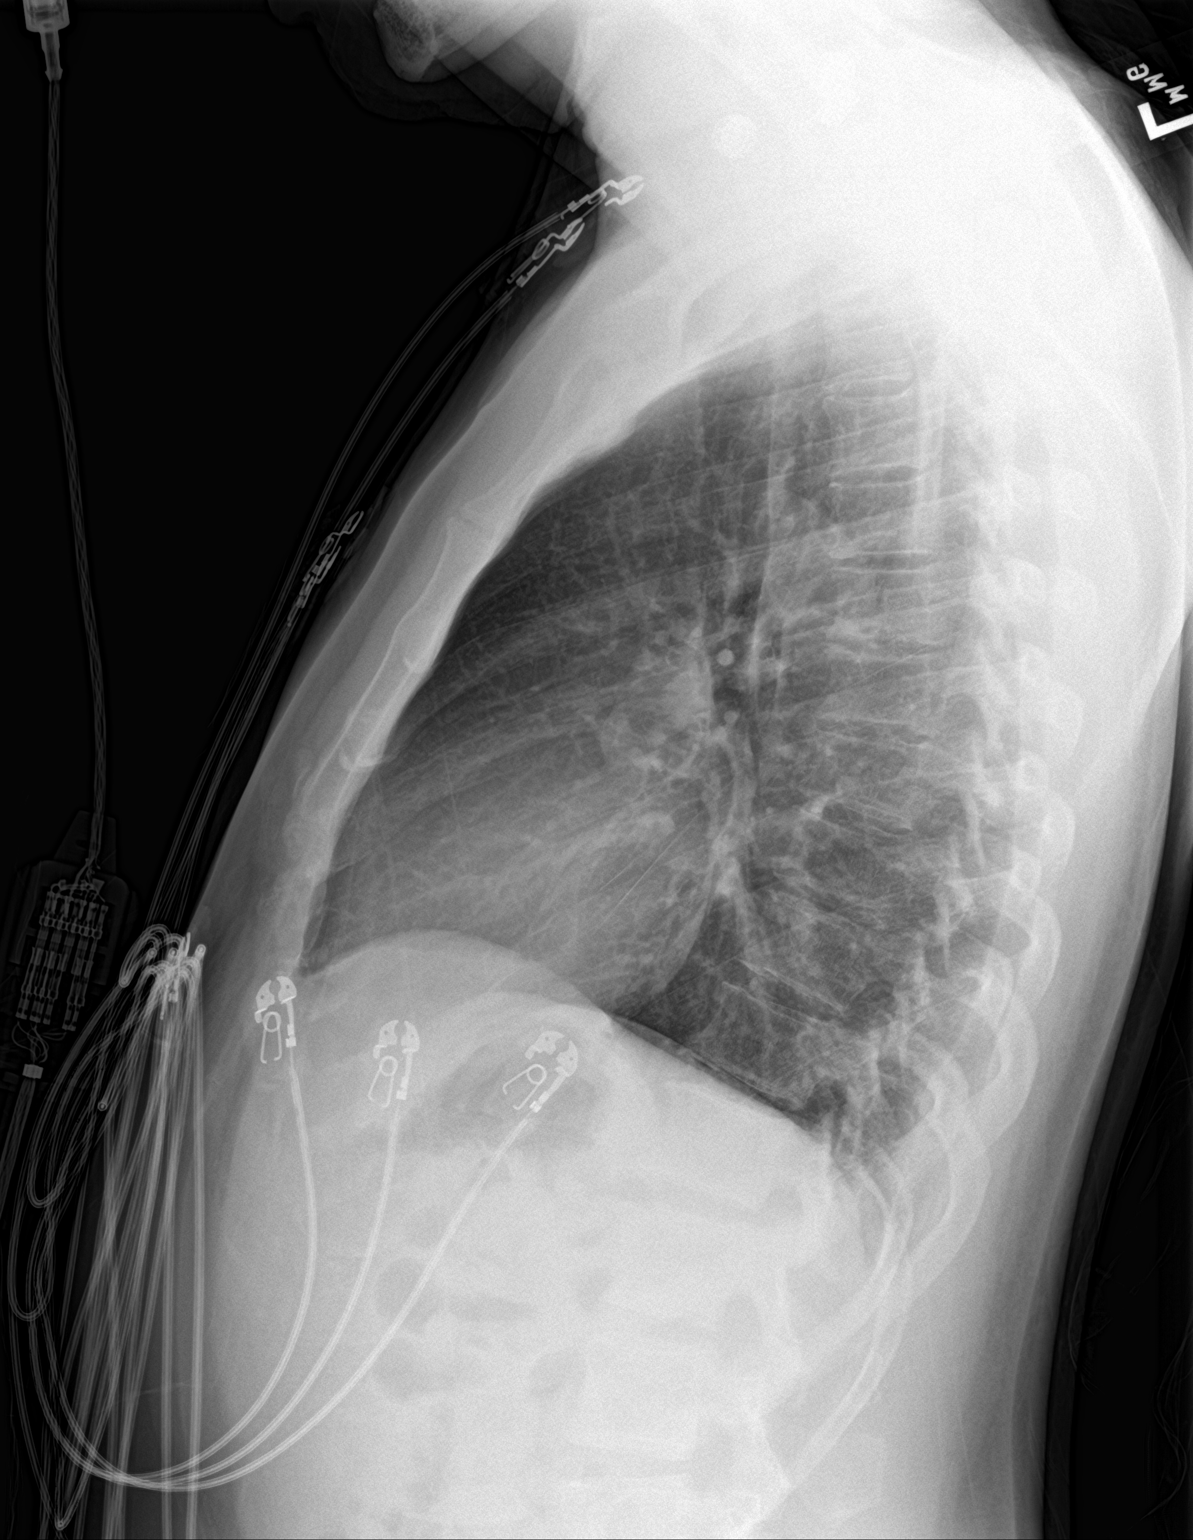

[2 of 2 positions shown; findings below may reference images not displayed]

FINDINGS: Previous reticular opacities in the mid lung zones have resolved.The
cardiomediastinal contours are normal. The lungs are clear.
Pulmonary vasculature is normal. No consolidation, pleural effusion,
or pneumothorax. No acute osseous abnormalities are seen.
IMPRESSION: No acute chest findings.

## 2019-02-22 ENCOUNTER — Ambulatory Visit: Payer: Self-pay | Admitting: Internal Medicine

## 2019-02-22 ENCOUNTER — Other Ambulatory Visit: Payer: Self-pay | Admitting: Internal Medicine

## 2019-02-22 DIAGNOSIS — B20 Human immunodeficiency virus [HIV] disease: Secondary | ICD-10-CM

## 2019-04-17 ENCOUNTER — Other Ambulatory Visit: Payer: Self-pay

## 2019-05-01 ENCOUNTER — Encounter: Payer: Self-pay | Admitting: Internal Medicine

## 2019-05-02 ENCOUNTER — Telehealth: Payer: Self-pay

## 2019-05-02 NOTE — Telephone Encounter (Signed)
Attempted to contact patient to reschedule missed appointment.Unable to leave a message

## 2019-05-14 ENCOUNTER — Other Ambulatory Visit: Payer: Self-pay

## 2019-05-14 ENCOUNTER — Ambulatory Visit: Payer: Self-pay

## 2019-05-15 ENCOUNTER — Telehealth: Payer: Self-pay | Admitting: Pharmacy Technician

## 2019-05-15 ENCOUNTER — Other Ambulatory Visit: Payer: Self-pay

## 2019-05-15 ENCOUNTER — Ambulatory Visit: Payer: Self-pay

## 2019-05-15 ENCOUNTER — Encounter: Payer: Self-pay | Admitting: Internal Medicine

## 2019-05-15 DIAGNOSIS — Z21 Asymptomatic human immunodeficiency virus [HIV] infection status: Secondary | ICD-10-CM

## 2019-05-15 NOTE — Telephone Encounter (Signed)
[facility] Patient Advocate Encounter  Gave a once in a lifetime 30 day card for Symtuza to this patient who is uninsured.    This is to bridge him until [facility] approval.   Venida Jarvis. Nadara Mustard [facility] Patient [facility] for Infectious Disease Phone: [phone] Fax:  [phone]

## 2019-05-16 LAB — T-HELPER CELL (CD4) - (RCID CLINIC ONLY)
CD4 % Helper T Cell: 34 % (ref 33–65)
CD4 T Cell Abs: 618 /uL (ref 400–1790)

## 2019-05-18 LAB — HIV-1 RNA QUANT-NO REFLEX-BLD
HIV 1 RNA Quant: <20 copies/mL — AB
HIV-1 RNA Quant, Log: <1.3 Log copies/mL — AB

## 2019-05-29 ENCOUNTER — Other Ambulatory Visit: Payer: Self-pay

## 2019-05-29 ENCOUNTER — Ambulatory Visit (INDEPENDENT_AMBULATORY_CARE_PROVIDER_SITE_OTHER): Payer: Self-pay | Admitting: Internal Medicine

## 2019-05-29 ENCOUNTER — Encounter: Payer: Self-pay | Admitting: Internal Medicine

## 2019-05-29 DIAGNOSIS — F17210 Nicotine dependence, cigarettes, uncomplicated: Secondary | ICD-10-CM

## 2019-05-29 DIAGNOSIS — Z23 Encounter for immunization: Secondary | ICD-10-CM

## 2019-05-29 DIAGNOSIS — Z21 Asymptomatic human immunodeficiency virus [HIV] infection status: Secondary | ICD-10-CM
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
## 2019-05-29 NOTE — Assessment & Plan Note (Signed)
He is hoping to quit smoking.  I asked him to set a quit date within the next 4 to 6 weeks.

## 2019-05-29 NOTE — Assessment & Plan Note (Signed)
Despite continued problems with adherence his infection is currently under very good control.  A long discussion with him about the utmost importance of better adherence.  I asked him to set his cell phone alarm to remind him to take it each morning after he gets off of work.  We will give him a pill canister.  He received his influenza vaccine today.  He will follow-up here after lab work in 3 months.

## 2019-07-12 ENCOUNTER — Other Ambulatory Visit: Payer: Self-pay | Admitting: Internal Medicine

## 2019-07-12 DIAGNOSIS — B20 Human immunodeficiency virus [HIV] disease: Secondary | ICD-10-CM

## 2019-08-15 ENCOUNTER — Other Ambulatory Visit: Payer: Self-pay

## 2019-08-15 ENCOUNTER — Ambulatory Visit: Payer: Self-pay

## 2019-08-15 DIAGNOSIS — Z113 Encounter for screening for infections with a predominantly sexual mode of transmission: Secondary | ICD-10-CM

## 2019-08-15 DIAGNOSIS — B20 Human immunodeficiency virus [HIV] disease: Secondary | ICD-10-CM

## 2019-08-29 ENCOUNTER — Telehealth: Payer: Self-pay | Admitting: *Deleted

## 2019-08-29 ENCOUNTER — Encounter: Payer: Self-pay | Admitting: Internal Medicine

## 2019-08-29 NOTE — Telephone Encounter (Signed)
RN left message asking patient to reschedule today's missed visit. Tion Tse M, RN    

## 2019-09-11 ENCOUNTER — Other Ambulatory Visit: Payer: Self-pay

## 2019-09-11 DIAGNOSIS — B20 Human immunodeficiency virus [HIV] disease: Secondary | ICD-10-CM

## 2019-09-11 MED ORDER — SYMTUZA 800-150-200-10 MG PO TABS: 1.0000 | ORAL_TABLET | Freq: Every day | ORAL | 0 refills | Status: DC

## 2019-09-24 ENCOUNTER — Other Ambulatory Visit: Payer: Self-pay

## 2019-09-24 ENCOUNTER — Ambulatory Visit: Payer: Self-pay

## 2019-09-25 ENCOUNTER — Other Ambulatory Visit: Payer: Self-pay

## 2019-09-25 ENCOUNTER — Ambulatory Visit: Payer: Self-pay

## 2019-10-05 ENCOUNTER — Other Ambulatory Visit: Payer: Self-pay | Admitting: Internal Medicine

## 2019-10-05 DIAGNOSIS — B20 Human immunodeficiency virus [HIV] disease: Secondary | ICD-10-CM

## 2019-10-10 ENCOUNTER — Ambulatory Visit: Payer: Self-pay | Admitting: Internal Medicine

## 2019-11-06 ENCOUNTER — Other Ambulatory Visit: Payer: Self-pay | Admitting: Internal Medicine

## 2019-11-06 DIAGNOSIS — B20 Human immunodeficiency virus [HIV] disease: Secondary | ICD-10-CM

## 2019-11-13 ENCOUNTER — Other Ambulatory Visit: Payer: Self-pay

## 2019-11-13 DIAGNOSIS — B20 Human immunodeficiency virus [HIV] disease: Secondary | ICD-10-CM

## 2019-11-13 MED ORDER — SYMTUZA 800-150-200-10 MG PO TABS: 1.0000 | ORAL_TABLET | Freq: Every day | ORAL | 0 refills | Status: DC

## 2019-11-15 ENCOUNTER — Ambulatory Visit (INDEPENDENT_AMBULATORY_CARE_PROVIDER_SITE_OTHER): Payer: Self-pay | Admitting: Internal Medicine

## 2019-11-15 ENCOUNTER — Encounter: Payer: Self-pay | Admitting: Internal Medicine

## 2019-11-15 ENCOUNTER — Other Ambulatory Visit: Payer: Self-pay

## 2019-11-15 ENCOUNTER — Ambulatory Visit: Payer: Self-pay

## 2019-11-15 DIAGNOSIS — Z21 Asymptomatic human immunodeficiency virus [HIV] infection status: Secondary | ICD-10-CM

## 2019-11-15 NOTE — Assessment & Plan Note (Signed)
His infection has been under good long-term control since starting antiretroviral therapy in 2016.  He will get repeat blood work today and continue [employer].  I encouraged him to go ahead and take the Covid vaccine.  He will follow-up in 6 months.

## 2019-11-15 NOTE — Progress Notes (Signed)
Patient Active Problem List   Diagnosis Date Noted  . HIV positive (HCC) 12/31/2013    Priority: High  . Cigarette smoker 05/24/2016  . Urinary frequency 05/24/2016  . Screening for STD (sexually transmitted disease) 02/28/2015  . Shingles 02/28/2015    Patient's Medications  New Prescriptions   No medications on file  Previous Medications   DARUNAVIR-COBICISCTAT-EMTRICITABINE-TENOFOVIR ALAFENAMIDE (SYMTUZA) 800-150-200-10 MG TABS    Take 1 tablet by mouth daily with breakfast. Must keep appointment on 4/22 for additional refills.  Modified Medications   No medications on file  Discontinued Medications   No medications on file    Subjective: Stephen Odom is in for his routine HIV follow-up visit.  He denies any problems obtaining, taking or tolerating his Symtuza.  He takes it each morning when he gets home from work.  He denies missing any doses.  However, he failed to renew his ADAP and has only 1 dose left.  He is no longer seeing his former boyfriend.  He has a new apartment and has his car back but has to get his driver's license before he can drive.  He has not been drinking alcohol or smoking marijuana since his last visit and he is feeling much better.  He has not gotten the Covid vaccine because he has heard rumors that it can kill people living with HIV infection.  Review of Systems: Review of Systems  Constitutional: Negative for fever and malaise/fatigue.  Respiratory: Negative for cough and shortness of breath.   Cardiovascular: Negative for chest pain.  Gastrointestinal: Negative for abdominal pain, diarrhea, nausea and vomiting.  Psychiatric/Behavioral: Negative for depression and substance abuse. The patient is not nervous/anxious.     Past Medical History:  Diagnosis Date  . HIV infection (HCC) Dx 2015  . Shingles     Social History   Tobacco Use  . Smoking status: Current Some Day Smoker    Packs/day: 0.30    Years: 21.00    Pack years: 6.30      Types: Cigarettes  . Smokeless tobacco: Never Used  . Tobacco comment: cutting back  Substance Use Topics  . Alcohol use: Yes    Alcohol/week: 24.0 standard drinks    Types: 24 Standard drinks or equivalent per week    Comment: weekends only  . Drug use: Not Currently    Types: Marijuana    Comment: hasn't used in last month    Family History  Problem Relation Age of Onset  . Diabetes Mother   . Hypertension Mother   . Cancer Maternal Uncle        lung, stomach   . Diabetes Maternal Grandmother   . Diabetes Father   . Hypertension Father     Allergies  Allergen Reactions  . Zovirax [Acyclovir] Nausea And Vomiting    Health Maintenance  Topic Date Due  . COVID-19 Vaccine (1) Never done  . INFLUENZA VACCINE  02/24/2020  . TETANUS/TDAP  05/07/2028  . PNEUMOCOCCAL POLYSACCHARIDE VACCINE AGE 87-64 HIGH RISK  Completed  . HIV Screening  Completed    Objective:  Vitals:   11/15/19 0849  BP: 135/86  Pulse: 72  Temp: 98.7 F (37.1 C)  SpO2: 99%  Weight: 138 lb (62.6 kg)  Height: 5\' 7"  (1.702 m)   Body mass index is 21.61 kg/m.  Physical Exam Constitutional:      Comments: He is in good spirits.  Cardiovascular:     Rate and Rhythm:  Normal rate and regular rhythm.     Heart sounds: No murmur.  Pulmonary:     Effort: Pulmonary effort is normal.     Breath sounds: Normal breath sounds.  Musculoskeletal:        General: No swelling or tenderness.  Skin:    Findings: No rash.  Neurological:     General: No focal deficit present.  Psychiatric:        Mood and Affect: Mood normal.     Lab Results Lab Results  Component Value Date   WBC 4.0 10/04/2018   HGB 14.3 10/04/2018   HCT 42.6 10/04/2018   MCV 84.4 10/04/2018   PLT 287 10/04/2018    Lab Results  Component Value Date   CREATININE 1.12 10/04/2018   BUN 22 10/04/2018   NA 142 10/04/2018   K 4.8 10/04/2018   CL 105 10/04/2018   CO2 31 10/04/2018    Lab Results  Component Value Date    ALT 13 10/04/2018   AST 18 10/04/2018   ALKPHOS 70 01/20/2018   BILITOT 0.4 10/04/2018    Lab Results  Component Value Date   CHOL 176 03/23/2017   HDL 84 03/23/2017   LDLCALC 66 03/23/2017   TRIG 128 03/23/2017   CHOLHDL 2.1 03/23/2017   Lab Results  Component Value Date   LABRPR NON-REACTIVE 10/04/2018   HIV 1 RNA Quant (copies/mL)  Date Value  05/15/2019 <20 DETECTED (A)  10/04/2018 439 (H)  05/17/2018 24 (H)   CD4 T Cell Abs (/uL)  Date Value  05/15/2019 618  10/04/2018 560  05/17/2018 520     Problem List Items Addressed This Visit      High   HIV positive ([place]) (Chronic)    His infection has been under good long-term control since starting antiretroviral therapy in 2016.  He will get repeat blood work today and continue [employer].  I encouraged him to go ahead and take the Covid vaccine.  He will follow-up in 6 months.      Relevant Orders   T-helper cell (CD4)- ([facility] clinic only)   HIV-1 RNA quant-no reflex-bld   CBC   Comprehensive metabolic panel   RPR   Lipid panel        Michel Bickers, MD [facility] for [place] [phone] pager   [phone] cell 11/15/2019, 9:09 AM

## 2019-11-16 LAB — T-HELPER CELL (CD4) - (RCID CLINIC ONLY)
CD4 % Helper T Cell: 36 % (ref 33–65)
CD4 T Cell Abs: 659 /uL (ref 400–1790)

## 2019-11-17 LAB — COMPREHENSIVE METABOLIC PANEL
AG Ratio: 1.7 (calc) (ref 1.0–2.5)
ALT: 11 U/L (ref 9–46)
AST: 18 U/L (ref 10–40)
Albumin: 4.7 g/dL (ref 3.6–5.1)
Alkaline phosphatase (APISO): 79 U/L (ref 36–130)
BUN: 18 mg/dL (ref 7–25)
CO2: 29 mmol/L (ref 20–32)
Calcium: 9.9 mg/dL (ref 8.6–10.3)
Chloride: 101 mmol/L (ref 98–110)
Creat: 0.81 mg/dL (ref 0.60–1.35)
Globulin: 2.7 g/dL (calc) (ref 1.9–3.7)
Glucose, Bld: 100 mg/dL — ABNORMAL HIGH (ref 65–99)
Potassium: 4.3 mmol/L (ref 3.5–5.3)
Sodium: 137 mmol/L (ref 135–146)
Total Bilirubin: 0.5 mg/dL (ref 0.2–1.2)
Total Protein: 7.4 g/dL (ref 6.1–8.1)

## 2019-11-17 LAB — LIPID PANEL
Cholesterol: 236 mg/dL — ABNORMAL HIGH (ref <200)
HDL: 71 mg/dL (ref >=40)
LDL Cholesterol (Calc): 142 mg/dL (calc) — ABNORMAL HIGH
Non-HDL Cholesterol (Calc): 165 mg/dL (calc) — ABNORMAL HIGH (ref <130)
Total CHOL/HDL Ratio: 3.3 (calc) (ref <5.0)
Triglycerides: 116 mg/dL (ref <150)

## 2019-11-17 LAB — CBC
HCT: 45.3 % (ref 38.5–50.0)
Hemoglobin: 14.8 g/dL (ref 13.2–17.1)
MCH: 28 pg (ref 27.0–33.0)
MCHC: 32.7 g/dL (ref 32.0–36.0)
MCV: 85.6 fL (ref 80.0–100.0)
MPV: 8.5 fL (ref 7.5–12.5)
Platelets: 283 10*3/uL (ref 140–400)
RBC: 5.29 10*6/uL (ref 4.20–5.80)
RDW: 12.3 % (ref 11.0–15.0)
WBC: 4.1 10*3/uL (ref 3.8–10.8)

## 2019-11-17 LAB — RPR: RPR Ser Ql: NONREACTIVE

## 2019-11-17 LAB — HIV-1 RNA QUANT-NO REFLEX-BLD
HIV 1 RNA Quant: <20 copies/mL — AB
HIV-1 RNA Quant, Log: <1.3 Log copies/mL — AB

## 2019-11-19 NOTE — Telephone Encounter (Signed)
RN spoke with patient. He was not fasting before this lab. RN discussed results, advised patient to reengage with primary care, as it is important to stay on top of these things especially if he has a family history of diabetes. Patient agreed. Andree Coss, RN

## 2020-01-15 ENCOUNTER — Other Ambulatory Visit: Payer: Self-pay | Admitting: Internal Medicine

## 2020-01-15 ENCOUNTER — Telehealth: Payer: Self-pay

## 2020-01-15 DIAGNOSIS — B20 Human immunodeficiency virus [HIV] disease: Secondary | ICD-10-CM

## 2020-01-15 NOTE — Telephone Encounter (Signed)
Attempted to call patient after receiving refill request for Symtuza. Last refill sent on 11/13/19.Will need to ask patient if he has been off medication. Lorenso Courier, [place]

## 2020-01-15 NOTE — Telephone Encounter (Signed)
Spoke with pharmacist at [facility] for refill history on Symtuza. Per pharmacist patient last filled prescription on 5/25. Is unable to provide additional history due to prescriptions being closed.  Lorenso Courier, [place]

## 2020-01-30 ENCOUNTER — Ambulatory Visit: Payer: Self-pay

## 2020-02-13 ENCOUNTER — Other Ambulatory Visit: Payer: Self-pay

## 2020-02-13 ENCOUNTER — Ambulatory Visit: Payer: Self-pay

## 2020-02-18 ENCOUNTER — Other Ambulatory Visit: Payer: Self-pay | Admitting: Internal Medicine

## 2020-02-18 DIAGNOSIS — B20 Human immunodeficiency virus [HIV] disease: Secondary | ICD-10-CM

## 2020-03-17 ENCOUNTER — Other Ambulatory Visit: Payer: Self-pay | Admitting: Internal Medicine

## 2020-03-17 DIAGNOSIS — B20 Human immunodeficiency virus [HIV] disease: Secondary | ICD-10-CM

## 2020-03-22 ENCOUNTER — Other Ambulatory Visit: Payer: Self-pay | Admitting: Internal Medicine

## 2020-03-22 DIAGNOSIS — B20 Human immunodeficiency virus [HIV] disease: Secondary | ICD-10-CM

## 2020-03-28 ENCOUNTER — Encounter: Payer: Self-pay | Admitting: Internal Medicine

## 2020-04-16 ENCOUNTER — Other Ambulatory Visit: Payer: Self-pay

## 2020-05-01 ENCOUNTER — Encounter: Payer: Self-pay | Admitting: Internal Medicine

## 2020-05-13 ENCOUNTER — Other Ambulatory Visit: Payer: Self-pay

## 2020-05-13 ENCOUNTER — Ambulatory Visit (INDEPENDENT_AMBULATORY_CARE_PROVIDER_SITE_OTHER): Payer: Self-pay

## 2020-05-13 DIAGNOSIS — Z21 Asymptomatic human immunodeficiency virus [HIV] infection status: Secondary | ICD-10-CM

## 2020-05-13 DIAGNOSIS — Z23 Encounter for immunization: Secondary | ICD-10-CM

## 2020-05-13 DIAGNOSIS — Z113 Encounter for screening for infections with a predominantly sexual mode of transmission: Secondary | ICD-10-CM

## 2020-05-14 LAB — T-HELPER CELL (CD4) - (RCID CLINIC ONLY)
CD4 % Helper T Cell: 37 % (ref 33–65)
CD4 T Cell Abs: 597 /uL (ref 400–1790)

## 2020-05-14 LAB — URINE CYTOLOGY ANCILLARY ONLY
Chlamydia: NEGATIVE
Comment: NEGATIVE
Comment: NORMAL
Neisseria Gonorrhea: NEGATIVE

## 2020-05-15 LAB — CBC
HCT: 43.5 % (ref 38.5–50.0)
Hemoglobin: 14.5 g/dL (ref 13.2–17.1)
MCH: 28.1 pg (ref 27.0–33.0)
MCHC: 33.3 g/dL (ref 32.0–36.0)
MCV: 84.3 fL (ref 80.0–100.0)
MPV: 8.8 fL (ref 7.5–12.5)
Platelets: 288 10*3/uL (ref 140–400)
RBC: 5.16 10*6/uL (ref 4.20–5.80)
RDW: 12.3 % (ref 11.0–15.0)
WBC: 5.2 10*3/uL (ref 3.8–10.8)

## 2020-05-15 LAB — LIPID PANEL
Cholesterol: 209 mg/dL — ABNORMAL HIGH (ref <200)
HDL: 74 mg/dL (ref >=40)
LDL Cholesterol (Calc): 109 mg/dL (calc) — ABNORMAL HIGH
Non-HDL Cholesterol (Calc): 135 mg/dL (calc) — ABNORMAL HIGH (ref <130)
Total CHOL/HDL Ratio: 2.8 (calc) (ref <5.0)
Triglycerides: 146 mg/dL (ref <150)

## 2020-05-15 LAB — COMPREHENSIVE METABOLIC PANEL
AG Ratio: 1.6 (calc) (ref 1.0–2.5)
ALT: 11 U/L (ref 9–46)
AST: 14 U/L (ref 10–40)
Albumin: 4.5 g/dL (ref 3.6–5.1)
Alkaline phosphatase (APISO): 109 U/L (ref 36–130)
BUN: 16 mg/dL (ref 7–25)
CO2: 31 mmol/L (ref 20–32)
Calcium: 9.7 mg/dL (ref 8.6–10.3)
Chloride: 104 mmol/L (ref 98–110)
Creat: 0.83 mg/dL (ref 0.60–1.35)
Globulin: 2.8 g/dL (calc) (ref 1.9–3.7)
Glucose, Bld: 112 mg/dL — ABNORMAL HIGH (ref 65–99)
Potassium: 4.6 mmol/L (ref 3.5–5.3)
Sodium: 141 mmol/L (ref 135–146)
Total Bilirubin: 0.4 mg/dL (ref 0.2–1.2)
Total Protein: 7.3 g/dL (ref 6.1–8.1)

## 2020-05-15 LAB — HIV-1 RNA QUANT-NO REFLEX-BLD
HIV 1 RNA Quant: 34 Copies/mL — ABNORMAL HIGH
HIV-1 RNA Quant, Log: 1.53 Log cps/mL — ABNORMAL HIGH

## 2020-05-15 LAB — RPR: RPR Ser Ql: NONREACTIVE

## 2020-05-29 ENCOUNTER — Ambulatory Visit: Payer: Self-pay | Admitting: Internal Medicine

## 2020-06-20 ENCOUNTER — Other Ambulatory Visit: Payer: Self-pay | Admitting: Internal Medicine

## 2020-06-20 DIAGNOSIS — B20 Human immunodeficiency virus [HIV] disease: Secondary | ICD-10-CM

## 2020-07-30 ENCOUNTER — Other Ambulatory Visit: Payer: Self-pay | Admitting: Internal Medicine

## 2020-07-30 DIAGNOSIS — B20 Human immunodeficiency virus [HIV] disease: Secondary | ICD-10-CM

## 2020-08-04 ENCOUNTER — Ambulatory Visit: Payer: Self-pay

## 2020-08-25 ENCOUNTER — Other Ambulatory Visit: Payer: Self-pay

## 2020-08-25 ENCOUNTER — Ambulatory Visit: Payer: Self-pay

## 2020-08-25 DIAGNOSIS — Z79899 Other long term (current) drug therapy: Secondary | ICD-10-CM

## 2020-08-25 DIAGNOSIS — Z113 Encounter for screening for infections with a predominantly sexual mode of transmission: Secondary | ICD-10-CM

## 2020-08-25 DIAGNOSIS — B20 Human immunodeficiency virus [HIV] disease: Secondary | ICD-10-CM

## 2020-08-26 LAB — URINE CYTOLOGY ANCILLARY ONLY
Chlamydia: NEGATIVE
Comment: NEGATIVE
Comment: NORMAL
Neisseria Gonorrhea: NEGATIVE

## 2020-08-26 LAB — T-HELPER CELL (CD4) - (RCID CLINIC ONLY)
CD4 % Helper T Cell: 32 % — ABNORMAL LOW (ref 33–65)
CD4 T Cell Abs: 488 /uL (ref 400–1790)

## 2020-08-28 LAB — COMPLETE METABOLIC PANEL WITH GFR
AG Ratio: 1.7 (calc) (ref 1.0–2.5)
ALT: 11 U/L (ref 9–46)
AST: 13 U/L (ref 10–40)
Albumin: 4.3 g/dL (ref 3.6–5.1)
Alkaline phosphatase (APISO): 67 U/L (ref 36–130)
BUN: 14 mg/dL (ref 7–25)
CO2: 31 mmol/L (ref 20–32)
Calcium: 9.3 mg/dL (ref 8.6–10.3)
Chloride: 109 mmol/L (ref 98–110)
Creat: 0.85 mg/dL (ref 0.60–1.35)
GFR, Est African American: 121 mL/min/{1.73_m2} (ref >=60)
GFR, Est Non African American: 104 mL/min/{1.73_m2} (ref >=60)
Globulin: 2.6 g/dL (calc) (ref 1.9–3.7)
Glucose, Bld: 91 mg/dL (ref 65–99)
Potassium: 5.2 mmol/L (ref 3.5–5.3)
Sodium: 144 mmol/L (ref 135–146)
Total Bilirubin: 0.3 mg/dL (ref 0.2–1.2)
Total Protein: 6.9 g/dL (ref 6.1–8.1)

## 2020-08-28 LAB — HIV-1 RNA QUANT-NO REFLEX-BLD
HIV 1 RNA Quant: 34 Copies/mL — ABNORMAL HIGH
HIV-1 RNA Quant, Log: 1.53 Log cps/mL — ABNORMAL HIGH

## 2020-08-28 LAB — CBC WITH DIFFERENTIAL/PLATELET
Absolute Monocytes: 444 cells/uL (ref 200–950)
Basophils Absolute: 32 cells/uL (ref 0–200)
Basophils Relative: 0.8 %
Eosinophils Absolute: 80 cells/uL (ref 15–500)
Eosinophils Relative: 2 %
HCT: 44 % (ref 38.5–50.0)
Hemoglobin: 14.6 g/dL (ref 13.2–17.1)
Lymphs Abs: 1520 cells/uL (ref 850–3900)
MCH: 28.5 pg (ref 27.0–33.0)
MCHC: 33.2 g/dL (ref 32.0–36.0)
MCV: 85.8 fL (ref 80.0–100.0)
MPV: 8.4 fL (ref 7.5–12.5)
Monocytes Relative: 11.1 %
Neutro Abs: 1924 cells/uL (ref 1500–7800)
Neutrophils Relative %: 48.1 %
Platelets: 287 10*3/uL (ref 140–400)
RBC: 5.13 10*6/uL (ref 4.20–5.80)
RDW: 12.7 % (ref 11.0–15.0)
Total Lymphocyte: 38 %
WBC: 4 10*3/uL (ref 3.8–10.8)

## 2020-08-28 LAB — LIPID PANEL
Cholesterol: 183 mg/dL (ref <200)
HDL: 77 mg/dL (ref >=40)
LDL Cholesterol (Calc): 87 mg/dL (calc)
Non-HDL Cholesterol (Calc): 106 mg/dL (calc) (ref <130)
Total CHOL/HDL Ratio: 2.4 (calc) (ref <5.0)
Triglycerides: 97 mg/dL (ref <150)

## 2020-08-28 LAB — RPR: RPR Ser Ql: NONREACTIVE

## 2020-09-09 ENCOUNTER — Encounter: Payer: Self-pay | Admitting: Internal Medicine

## 2020-09-16 ENCOUNTER — Encounter: Payer: Self-pay | Admitting: Internal Medicine

## 2020-09-29 ENCOUNTER — Other Ambulatory Visit: Payer: Self-pay | Admitting: Internal Medicine

## 2020-09-29 DIAGNOSIS — B20 Human immunodeficiency virus [HIV] disease: Secondary | ICD-10-CM

## 2020-10-07 ENCOUNTER — Other Ambulatory Visit: Payer: Self-pay

## 2020-10-07 ENCOUNTER — Encounter: Payer: Self-pay | Admitting: Internal Medicine

## 2020-10-07 ENCOUNTER — Ambulatory Visit (INDEPENDENT_AMBULATORY_CARE_PROVIDER_SITE_OTHER): Payer: Self-pay | Admitting: Internal Medicine

## 2020-10-07 DIAGNOSIS — Z21 Asymptomatic human immunodeficiency virus [HIV] infection status: Secondary | ICD-10-CM

## 2020-10-07 DIAGNOSIS — B20 Human immunodeficiency virus [HIV] disease: Secondary | ICD-10-CM

## 2020-10-07 MED ORDER — SYMTUZA 800-150-200-10 MG PO TABS: 1.0000 | ORAL_TABLET | Freq: Every day | ORAL | 11 refills | Status: DC

## 2020-10-07 NOTE — Assessment & Plan Note (Signed)
His infection remains under good, long-term control.  He will continue Symtuza and follow-up after lab work in 1 year.

## 2020-10-07 NOTE — Progress Notes (Signed)
Patient Active Problem List   Diagnosis Date Noted  . HIV positive (HCC) 12/31/2013    Priority: High  . Former smoker 05/24/2016  . Screening for STD (sexually transmitted disease) 02/28/2015  . Shingles 02/28/2015    Patient's Medications  New Prescriptions   No medications on file  Previous Medications   No medications on file  Modified Medications   Modified Medication Previous Medication   DARUNAVIR-COBICISCTAT-EMTRICITABINE-TENOFOVIR ALAFENAMIDE (SYMTUZA) 800-150-200-10 MG TABS SYMTUZA 800-150-200-10 MG TABS      Take 1 tablet by mouth daily with breakfast.    TAKE 1 TABLET BY MOUTH DAILY WITH BREAKFAST  Discontinued Medications   No medications on file    Subjective: Stephen Odom is in for his routine HIV follow-up visit.  He denies any problems obtaining, taking or tolerating his Symtuza and does not recall missing doses.  He quit smoking cigarettes several months ago and is feeling much better.  He notes that he can breathe much more easily.  He received his Covid booster vaccine last fall.  He is feeling well.  He denies any anxiety or depression.  Review of Systems: Review of Systems  Constitutional: Negative for fever.  Psychiatric/Behavioral: Negative for depression. The patient is not nervous/anxious.     Past Medical History:  Diagnosis Date  . HIV infection (HCC) Dx 2015  . Shingles     Social History   Tobacco Use  . Smoking status: Light Tobacco Smoker    Packs/day: 0.30    Years: 21.00    Pack years: 6.30    Types: E-cigarettes  . Smokeless tobacco: Never Used  . Tobacco comment: light   Vaping Use  . Vaping Use: Never used  Substance Use Topics  . Alcohol use: Yes    Alcohol/week: 24.0 standard drinks    Types: 24 Standard drinks or equivalent per week    Comment: weekends only  . Drug use: Yes    Types: Marijuana    Comment: daily     Family History  Problem Relation Age of Onset  . Diabetes Mother   . Hypertension Mother    . Cancer Maternal Uncle        lung, stomach   . Diabetes Maternal Grandmother   . Diabetes Father   . Hypertension Father     Allergies  Allergen Reactions  . Zovirax [Acyclovir] Nausea And Vomiting    Health Maintenance  Topic Date Due  . COLONOSCOPY (Pts 45-74yrs Insurance coverage will need to be confirmed)  Never done  . COVID-19 Vaccine (4 - Booster for [employer] series) 10/16/2020  . TETANUS/TDAP  05/07/2028  . INFLUENZA VACCINE  Completed  . PNEUMOCOCCAL POLYSACCHARIDE VACCINE AGE 41-64 HIGH RISK  Completed  . Hepatitis C Screening  Completed  . HIV Screening  Completed  . HPV VACCINES  Aged Out    Objective:  Vitals:   10/07/20 1359  BP: 136/88  Pulse: 66  Temp: 97.9 F (36.6 C)  TempSrc: Oral  Weight: 149 lb (67.6 kg)   Body mass index is 23.34 kg/m.  Physical Exam Constitutional:      Comments: His spirits are good.  Cardiovascular:     Rate and Rhythm: Normal rate and regular rhythm.     Heart sounds: No murmur heard.   Pulmonary:     Effort: Pulmonary effort is normal.     Breath sounds: Normal breath sounds.  Psychiatric:        Mood and Affect:  Mood normal.     Lab Results Lab Results  Component Value Date   WBC 4.0 08/25/2020   HGB 14.6 08/25/2020   HCT 44.0 08/25/2020   MCV 85.8 08/25/2020   PLT 287 08/25/2020    Lab Results  Component Value Date   CREATININE 0.85 08/25/2020   BUN 14 08/25/2020   NA 144 08/25/2020   K 5.2 08/25/2020   CL 109 08/25/2020   CO2 31 08/25/2020    Lab Results  Component Value Date   ALT 11 08/25/2020   AST 13 08/25/2020   ALKPHOS 70 01/20/2018   BILITOT 0.3 08/25/2020    Lab Results  Component Value Date   CHOL 183 08/25/2020   HDL 77 08/25/2020   LDLCALC 87 08/25/2020   TRIG 97 08/25/2020   CHOLHDL 2.4 08/25/2020   Lab Results  Component Value Date   LABRPR NON-REACTIVE 08/25/2020   HIV 1 RNA Quant  Date Value  08/25/2020 34 Copies/mL (H)  05/13/2020 34 Copies/mL (H)  11/15/2019  <20 DETECTED copies/mL (A)   CD4 T Cell Abs (/uL)  Date Value  08/25/2020 488  05/13/2020 597  11/15/2019 659     Problem List Items Addressed This Visit      High   HIV positive (HCC) (Chronic)    His infection remains under good, long-term control.  He will continue Symtuza and follow-up after lab work in 1 year.      Relevant Medications   Darunavir-Cobicisctat-Emtricitabine-Tenofovir Alafenamide (SYMTUZA) 800-150-200-10 MG TABS   Other Relevant Orders   CBC   T-helper cell (CD4)- ([facility] clinic only)   Comprehensive metabolic panel   Lipid panel   RPR   HIV-1 RNA quant-no reflex-bld    Other Visit Diagnoses    Human immunodeficiency virus (HIV) disease (HCC)       Relevant Medications   Darunavir-Cobicisctat-Emtricitabine-Tenofovir Alafenamide (SYMTUZA) 800-150-200-10 MG TABS        Cliffton Asters, MD [facility] for Infectious Disease [facility] [phone] pager   [phone] cell 10/07/2020, 2:13 PM

## 2021-02-03 ENCOUNTER — Ambulatory Visit: Payer: Self-pay

## 2021-05-14 ENCOUNTER — Other Ambulatory Visit: Payer: Self-pay

## 2021-05-14 ENCOUNTER — Ambulatory Visit: Payer: Self-pay

## 2021-05-14 ENCOUNTER — Other Ambulatory Visit: Payer: Self-pay | Admitting: Pharmacist

## 2021-05-14 DIAGNOSIS — B20 Human immunodeficiency virus [HIV] disease: Secondary | ICD-10-CM

## 2021-05-14 MED ORDER — SYMTUZA 800-150-200-10 MG PO TABS: 1.0000 | ORAL_TABLET | Freq: Every day | ORAL | 0 refills | Status: AC

## 2021-05-14 NOTE — Progress Notes (Signed)
Medication Samples have been provided to the patient.  Drug name: Symtuza        Strength: 800/150/200/10 mg Qty: 1 bottle (30 tablets)  LOT: [id number]   Exp.Date: 09/23/2021  Dosing instructions: Take one tablet by mouth once daily with food  The patient has been instructed regarding the correct time, dose, and frequency of taking this medication, including desired effects and most common side effects.   Lamonda Noxon L. Novice Vrba, PharmD, BCIDP, AAHIVP, CPP Clinical Pharmacist Practitioner Infectious Diseases Clinical Pharmacist [facility] 07/07/2020, 10:07 AM  

## 2021-05-15 NOTE — Telephone Encounter (Signed)
Thanks Dorie!

## 2021-05-18 ENCOUNTER — Other Ambulatory Visit: Payer: Self-pay

## 2021-05-18 ENCOUNTER — Ambulatory Visit: Payer: Self-pay

## 2021-05-20 ENCOUNTER — Other Ambulatory Visit: Payer: Self-pay | Admitting: Pharmacist

## 2021-06-08 ENCOUNTER — Encounter: Payer: Self-pay | Admitting: Internal Medicine

## 2021-09-22 ENCOUNTER — Other Ambulatory Visit: Payer: Self-pay

## 2021-09-22 ENCOUNTER — Ambulatory Visit: Payer: Self-pay

## 2021-09-22 DIAGNOSIS — B20 Human immunodeficiency virus [HIV] disease: Secondary | ICD-10-CM

## 2021-09-22 DIAGNOSIS — Z21 Asymptomatic human immunodeficiency virus [HIV] infection status: Secondary | ICD-10-CM

## 2021-09-24 LAB — COMPREHENSIVE METABOLIC PANEL
AG Ratio: 1.6 (calc) (ref 1.0–2.5)
ALT: 7 U/L — ABNORMAL LOW (ref 9–46)
AST: 13 U/L (ref 10–40)
Albumin: 4.3 g/dL (ref 3.6–5.1)
Alkaline phosphatase (APISO): 59 U/L (ref 36–130)
BUN: 19 mg/dL (ref 7–25)
CO2: 28 mmol/L (ref 20–32)
Calcium: 9.5 mg/dL (ref 8.6–10.3)
Chloride: 109 mmol/L (ref 98–110)
Creat: 0.86 mg/dL (ref 0.60–1.29)
Globulin: 2.7 g/dL (calc) (ref 1.9–3.7)
Glucose, Bld: 95 mg/dL (ref 65–99)
Potassium: 5 mmol/L (ref 3.5–5.3)
Sodium: 140 mmol/L (ref 135–146)
Total Bilirubin: 0.3 mg/dL (ref 0.2–1.2)
Total Protein: 7 g/dL (ref 6.1–8.1)

## 2021-09-24 LAB — CBC
HCT: 43 % (ref 38.5–50.0)
Hemoglobin: 14.3 g/dL (ref 13.2–17.1)
MCH: 28.6 pg (ref 27.0–33.0)
MCHC: 33.3 g/dL (ref 32.0–36.0)
MCV: 86 fL (ref 80.0–100.0)
MPV: 8.6 fL (ref 7.5–12.5)
Platelets: 272 10*3/uL (ref 140–400)
RBC: 5 10*6/uL (ref 4.20–5.80)
RDW: 12.9 % (ref 11.0–15.0)
WBC: 3.3 10*3/uL — ABNORMAL LOW (ref 3.8–10.8)

## 2021-09-24 LAB — LIPID PANEL
Cholesterol: 222 mg/dL — ABNORMAL HIGH (ref <200)
HDL: 75 mg/dL (ref >=40)
LDL Cholesterol (Calc): 125 mg/dL (calc) — ABNORMAL HIGH
Non-HDL Cholesterol (Calc): 147 mg/dL (calc) — ABNORMAL HIGH (ref <130)
Total CHOL/HDL Ratio: 3 (calc) (ref <5.0)
Triglycerides: 108 mg/dL (ref <150)

## 2021-09-24 LAB — RPR: RPR Ser Ql: NONREACTIVE

## 2021-09-24 LAB — HIV-1 RNA QUANT-NO REFLEX-BLD
HIV 1 RNA Quant: <20 Copies/mL — ABNORMAL HIGH
HIV-1 RNA Quant, Log: <1.3 Log cps/mL — ABNORMAL HIGH

## 2021-09-24 LAB — T-HELPER CELLS (CD4) COUNT (NOT AT ARMC)
Absolute CD4: 620 cells/uL (ref 490–1740)
CD4 T Helper %: 37 % (ref 30–61)
Total lymphocyte count: 1691 cells/uL (ref 850–3900)

## 2021-10-06 ENCOUNTER — Ambulatory Visit: Payer: Self-pay | Admitting: Internal Medicine

## 2021-10-23 ENCOUNTER — Other Ambulatory Visit: Payer: Self-pay | Admitting: Internal Medicine

## 2021-10-23 DIAGNOSIS — B20 Human immunodeficiency virus [HIV] disease: Secondary | ICD-10-CM

## 2021-10-23 NOTE — Telephone Encounter (Signed)
Mychart message sent to patient.

## 2021-10-27 ENCOUNTER — Other Ambulatory Visit: Payer: Self-pay

## 2021-10-27 ENCOUNTER — Encounter: Payer: Self-pay | Admitting: Internal Medicine

## 2021-10-27 ENCOUNTER — Ambulatory Visit (INDEPENDENT_AMBULATORY_CARE_PROVIDER_SITE_OTHER): Payer: Self-pay | Admitting: Internal Medicine

## 2021-10-27 ENCOUNTER — Ambulatory Visit (INDEPENDENT_AMBULATORY_CARE_PROVIDER_SITE_OTHER): Payer: Self-pay

## 2021-10-27 DIAGNOSIS — B20 Human immunodeficiency virus [HIV] disease: Secondary | ICD-10-CM

## 2021-10-27 DIAGNOSIS — Z23 Encounter for immunization: Secondary | ICD-10-CM

## 2021-10-27 DIAGNOSIS — Z21 Asymptomatic human immunodeficiency virus [HIV] infection status: Secondary | ICD-10-CM

## 2021-10-27 MED ORDER — SYMTUZA 800-150-200-10 MG PO TABS: 1.0000 | ORAL_TABLET | Freq: Every day | ORAL | 11 refills | Status: DC

## 2021-10-27 NOTE — Assessment & Plan Note (Signed)
His infection has been under excellent, long-term control.  He will continue Symtuza and follow-up after lab work in 1 year.  He received a COVID booster vaccine here today. ?

## 2021-10-27 NOTE — Progress Notes (Signed)
? ?   ? ? ? ? ?Patient Active Problem List  ? Diagnosis Date Noted  ? HIV positive ([place]) 12/31/2013  ?  Priority: High  ? Former smoker 05/24/2016  ? Screening for STD (sexually transmitted disease) 02/28/2015  ? Shingles 02/28/2015  ? ? ?Patient's Medications  ?New Prescriptions  ? No medications on file  ?Previous Medications  ? No medications on file  ?Modified Medications  ? Modified Medication Previous Medication  ? DARUNAVIR-COBICISTAT-EMTRICITABINE-TENOFOVIR ALAFENAMIDE (SYMTUZA) 800-150-200-10 MG TABS Darunavir-Cobicisctat-Emtricitabine-Tenofovir Alafenamide (SYMTUZA) 800-150-200-10 MG TABS  ?    Take 1 tablet by mouth daily with breakfast.    Take 1 tablet by mouth daily with breakfast.  ?Discontinued Medications  ? No medications on file  ? ? ?Subjective: ?Stephen Odom is in for his routine HIV follow-up visit.  He took his last Symtuza 2 days ago before he ran out before this visit.  He does not think that he has been missing doses frequently over the past 12 months.  He quit smoking cigarettes almost 2 years ago and has not relapsed.  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  He enjoys his work. ? ?Review of Systems: ?Review of Systems  ?Constitutional:  Negative for fever and weight loss.  ? ?Past Medical History:  ?Diagnosis Date  ? HIV infection ([place]) Dx 2015  ? Shingles   ? ? ?Social History  ? ?Tobacco Use  ? Smoking status: Light Smoker  ?  Packs/day: 0.30  ?  Years: 21.00  ?  Pack years: 6.30  ?  Types: E-cigarettes, Cigarettes  ? Smokeless tobacco: Never  ? Tobacco comments:  ?  light   ?Vaping Use  ? Vaping Use: Never used  ?Substance Use Topics  ? Alcohol use: Yes  ?  Alcohol/week: 24.0 standard drinks  ?  Types: 24 Standard drinks or equivalent per week  ?  Comment: weekends only  ? Drug use: Yes  ?  Types: Marijuana  ?  Comment: daily   ? ? ?Family History  ?Problem Relation Age of Onset  ? Diabetes Mother   ? Hypertension Mother   ? Cancer Maternal  Uncle   ?     lung, stomach   ? Diabetes Maternal Grandmother   ? Diabetes Father   ? Hypertension Father   ? ? ?Allergies  ?Allergen Reactions  ? Zovirax [Acyclovir] Nausea And Vomiting  ? ? ?Health Maintenance  ?Topic Date Due  ? COLONOSCOPY (Pts 45-64yrs Insurance coverage will need to be confirmed)  Never done  ? COVID-19 Vaccine (4 - Booster for [place] series) 06/13/2020  ? INFLUENZA VACCINE  02/23/2022  ? TETANUS/TDAP  05/07/2028  ? Hepatitis C Screening  Completed  ? HIV Screening  Completed  ? HPV VACCINES  Aged Out  ? ? ?Objective: ? ?Vitals:  ? 10/27/21 1609  ?BP: 116/78  ?Pulse: 66  ?Temp: 98.1 ?F (36.7 ?C)  ?TempSrc: Temporal  ?SpO2: 99%  ?Weight: 145 lb (65.8 kg)  ? ?Body mass index is 22.71 kg/m?. ? ?Physical Exam ?Constitutional:   ?   Comments: He is in good spirits.  ?Cardiovascular:  ?   Rate and Rhythm: Normal rate.  ?Pulmonary:  ?   Effort: Pulmonary effort is normal.  ?Psychiatric:     ?   Mood and Affect: Mood normal.  ? ? ?Lab Results ?Lab Results  ?Component Value Date  ? WBC 3.3 (L) 09/22/2021  ? HGB 14.3 09/22/2021  ? HCT  43.0 09/22/2021  ? MCV 86.0 09/22/2021  ? PLT 272 09/22/2021  ?  ?Lab Results  ?Component Value Date  ? CREATININE 0.86 09/22/2021  ? BUN 19 09/22/2021  ? NA 140 09/22/2021  ? K 5.0 09/22/2021  ? CL 109 09/22/2021  ? CO2 28 09/22/2021  ?  ?Lab Results  ?Component Value Date  ? ALT 7 (L) 09/22/2021  ? AST 13 09/22/2021  ? ALKPHOS 70 01/20/2018  ? BILITOT 0.3 09/22/2021  ?  ?Lab Results  ?Component Value Date  ? CHOL 222 (H) 09/22/2021  ? HDL 75 09/22/2021  ? LDLCALC 125 (H) 09/22/2021  ? TRIG 108 09/22/2021  ? CHOLHDL 3.0 09/22/2021  ? ?Lab Results  ?Component Value Date  ? LABRPR NON-REACTIVE 09/22/2021  ? ?HIV 1 RNA Quant (Copies/mL)  ?Date Value  ?09/22/2021 <20 (H)  ?08/25/2020 34 (H)  ?05/13/2020 34 (H)  ? ?CD4 T Cell Abs (/uL)  ?Date Value  ?08/25/2020 488  ?05/13/2020 597  ?11/15/2019 659  ? ?  ?Problem List Items Addressed This Visit   ? ?  ? High  ? HIV positive  ([place]) (Chronic)  ?  His infection has been under excellent, long-term control.  He will continue Symtuza and follow-up after lab work in 1 year.  He received a COVID booster vaccine here today. ?  ?  ? Relevant Medications  ? Darunavir-Cobicistat-Emtricitabine-Tenofovir Alafenamide (SYMTUZA) 800-150-200-10 MG TABS  ? Other Relevant Orders  ? CBC  ? T-helper cells (CD4) count (not at [facility])  ? Comprehensive metabolic panel  ? Lipid panel  ? RPR  ? HIV-1 RNA quant-no reflex-bld  ? ?Other Visit Diagnoses   ? ? Human immunodeficiency virus (HIV) disease ([place])      ? Relevant Medications  ? Darunavir-Cobicistat-Emtricitabine-Tenofovir Alafenamide (SYMTUZA) 800-150-200-10 MG TABS  ? ?  ? ? ? ? ?Michel Bickers, MD ?[facility] for Infectious Disease ?[facility] ?[phone] pager   [phone] cell ?10/27/2021, 4:23 PM ? ?

## 2022-01-27 ENCOUNTER — Ambulatory Visit: Payer: Self-pay

## 2022-02-01 ENCOUNTER — Ambulatory Visit: Payer: Self-pay

## 2022-02-01 ENCOUNTER — Other Ambulatory Visit: Payer: Self-pay

## 2022-03-14 ENCOUNTER — Encounter (INDEPENDENT_AMBULATORY_CARE_PROVIDER_SITE_OTHER): Payer: Self-pay

## 2022-08-10 ENCOUNTER — Other Ambulatory Visit: Payer: Self-pay

## 2022-08-10 ENCOUNTER — Ambulatory Visit (INDEPENDENT_AMBULATORY_CARE_PROVIDER_SITE_OTHER): Payer: Self-pay

## 2022-08-10 ENCOUNTER — Ambulatory Visit (INDEPENDENT_AMBULATORY_CARE_PROVIDER_SITE_OTHER): Payer: Self-pay | Admitting: Internal Medicine

## 2022-08-10 ENCOUNTER — Encounter: Payer: Self-pay | Admitting: Internal Medicine

## 2022-08-10 VITALS — BP 131/91 | HR 86 | Temp 98.8°F | Ht 67.0 in | Wt 158.0 lb

## 2022-08-10 DIAGNOSIS — Z21 Asymptomatic human immunodeficiency virus [HIV] infection status: Secondary | ICD-10-CM

## 2022-08-10 DIAGNOSIS — Z23 Encounter for immunization: Secondary | ICD-10-CM

## 2022-08-10 DIAGNOSIS — B20 Human immunodeficiency virus [HIV] disease: Secondary | ICD-10-CM

## 2022-08-10 MED ORDER — SYMTUZA 800-150-200-10 MG PO TABS: 1.0000 | ORAL_TABLET | Freq: Every day | ORAL | 11 refills | Status: DC

## 2022-08-10 NOTE — Progress Notes (Signed)
Patient Active Problem List   Diagnosis Date Noted   HIV positive ([place]) 12/31/2013    Priority: High   Former smoker 05/24/2016   Screening for STD (sexually transmitted disease) 02/28/2015   Shingles 02/28/2015    Patient's Medications  New Prescriptions   No medications on file  Previous Medications   No medications on file  Modified Medications   Modified Medication Previous Medication   DARUNAVIR-COBICISTAT-EMTRICITABINE-TENOFOVIR ALAFENAMIDE (SYMTUZA) 800-150-200-10 MG TABS Darunavir-Cobicistat-Emtricitabine-Tenofovir Alafenamide (SYMTUZA) 800-150-200-10 MG TABS      Take 1 tablet by mouth daily with breakfast.    Take 1 tablet by mouth daily with breakfast.  Discontinued Medications   No medications on file    Subjective: Stephen Odom is in for his routine HIV follow-up visit.  He denies any problems obtaining, taking or tolerating Symtuza.  The only missed doses he recalls was 2 to 3 days several months ago when he moved to a new apartment.  He is not on any other new medications.  He is feeling well.  He denies feeling anxious or depressed.  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  He enjoys his work.  He quit smoking cigarettes several years ago.  Review of Systems: Review of Systems  Constitutional:  Negative for fever and weight loss.  Psychiatric/Behavioral:  Negative for depression. The patient is not nervous/anxious.     Past Medical History:  Diagnosis Date   HIV infection ([place]) Dx 2015   Shingles     Social History   Tobacco Use   Smoking status: Light Smoker    Types: E-cigarettes   Smokeless tobacco: Never   Tobacco comments:    light   Vaping Use   Vaping Use: Never used  Substance Use Topics   Alcohol use: Yes    Alcohol/week: 24.0 standard drinks of alcohol    Types: 24 Standard drinks or equivalent per week    Comment: weekends only   Drug use: Yes    Types: Marijuana    Comment: occ    Family History   Problem Relation Age of Onset   Diabetes Mother    Hypertension Mother    Cancer Maternal Uncle        lung, stomach    Diabetes Maternal Grandmother    Diabetes Father    Hypertension Father     Allergies  Allergen Reactions   Zovirax [Acyclovir] Nausea And Vomiting    Health Maintenance  Topic Date Due   COLONOSCOPY (Pts 45-54yrs Insurance coverage will need to be confirmed)  Never done   INFLUENZA VACCINE  02/23/2022   COVID-19 Vaccine (5 - 2023-24 season) 03/26/2022   DTaP/Tdap/Td (2 - Td or Tdap) 05/07/2028   Hepatitis C Screening  Completed   HIV Screening  Completed   HPV VACCINES  Aged Out    Objective:  Vitals:   08/10/22 1507  BP: (!) 149/102  Pulse: 86  Temp: 98.8 F (37.1 C)  TempSrc: Oral  Weight: 158 lb (71.7 kg)  Height: 5\' 7"  (1.702 m)   Body mass index is 24.75 kg/m.  Physical Exam Constitutional:      Comments: His spirits are good.  Cardiovascular:     Rate and Rhythm: Normal rate and regular rhythm.     Heart sounds: No murmur heard. Pulmonary:     Effort: Pulmonary effort is normal.     Breath sounds: Normal breath sounds.  Psychiatric:  Mood and Affect: Mood normal.     Lab Results Lab Results  Component Value Date   WBC 3.3 (L) 09/22/2021   HGB 14.3 09/22/2021   HCT 43.0 09/22/2021   MCV 86.0 09/22/2021   PLT 272 09/22/2021    Lab Results  Component Value Date   CREATININE 0.86 09/22/2021   BUN 19 09/22/2021   NA 140 09/22/2021   K 5.0 09/22/2021   CL 109 09/22/2021   CO2 28 09/22/2021    Lab Results  Component Value Date   ALT 7 (L) 09/22/2021   AST 13 09/22/2021   ALKPHOS 70 01/20/2018   BILITOT 0.3 09/22/2021    Lab Results  Component Value Date   CHOL 222 (H) 09/22/2021   HDL 75 09/22/2021   LDLCALC 125 (H) 09/22/2021   TRIG 108 09/22/2021   CHOLHDL 3.0 09/22/2021   Lab Results  Component Value Date   LABRPR NON-REACTIVE 09/22/2021   HIV 1 RNA Quant (Copies/mL)  Date Value  09/22/2021 <20  (H)  08/25/2020 34 (H)  05/13/2020 34 (H)   CD4 T Cell Abs (/uL)  Date Value  08/25/2020 488  05/13/2020 597  11/15/2019 659     Problem List Items Addressed This Visit       High   HIV positive ([place]) - Primary (Chronic)    His infection has been under excellent, long-term control.  He will get updated blood work today and continue [employer].  He received his annual influenza and an updated COVID-vaccine here today.  He will follow-up in 1 year.      Relevant Medications   Darunavir-Cobicistat-Emtricitabine-Tenofovir Alafenamide (SYMTUZA) 800-150-200-10 MG TABS   Other Relevant Orders   T-helper cells (CD4) count (not at [facility])   HIV-1 RNA quant-no reflex-bld   CBC   Comprehensive metabolic panel   RPR   Lipid panel   Other Visit Diagnoses     Human immunodeficiency virus (HIV) disease ([place])       Relevant Medications   Darunavir-Cobicistat-Emtricitabine-Tenofovir Alafenamide (SYMTUZA) 800-150-200-10 MG TABS         Stephen Bickers, MD [facility] for Infectious [place] [phone] pager   [phone] cell 08/10/2022, 3:24 PM

## 2022-08-10 NOTE — Assessment & Plan Note (Signed)
His infection has been under excellent, long-term control.  He will get updated blood work today and continue [employer].  He received his annual influenza and an updated COVID-vaccine here today.  He will follow-up in 1 year.

## 2022-08-11 LAB — T-HELPER CELLS (CD4) COUNT (NOT AT ARMC)
CD4 % Helper T Cell: 38 % (ref 33–65)
CD4 T Cell Abs: 709 /uL (ref 400–1790)

## 2022-08-12 LAB — COMPREHENSIVE METABOLIC PANEL
AG Ratio: 1.7 (calc) (ref 1.0–2.5)
ALT: 10 U/L (ref 9–46)
AST: 16 U/L (ref 10–40)
Albumin: 4.7 g/dL (ref 3.6–5.1)
Alkaline phosphatase (APISO): 67 U/L (ref 36–130)
BUN: 16 mg/dL (ref 7–25)
CO2: 29 mmol/L (ref 20–32)
Calcium: 10.1 mg/dL (ref 8.6–10.3)
Chloride: 104 mmol/L (ref 98–110)
Creat: 1.03 mg/dL (ref 0.60–1.29)
Globulin: 2.8 g/dL (calc) (ref 1.9–3.7)
Glucose, Bld: 79 mg/dL (ref 65–99)
Potassium: 4.5 mmol/L (ref 3.5–5.3)
Sodium: 140 mmol/L (ref 135–146)
Total Bilirubin: 0.3 mg/dL (ref 0.2–1.2)
Total Protein: 7.5 g/dL (ref 6.1–8.1)

## 2022-08-12 LAB — CBC
HCT: 43 % (ref 38.5–50.0)
Hemoglobin: 14.5 g/dL (ref 13.2–17.1)
MCH: 28.9 pg (ref 27.0–33.0)
MCHC: 33.7 g/dL (ref 32.0–36.0)
MCV: 85.7 fL (ref 80.0–100.0)
MPV: 8.7 fL (ref 7.5–12.5)
Platelets: 279 10*3/uL (ref 140–400)
RBC: 5.02 10*6/uL (ref 4.20–5.80)
RDW: 12.8 % (ref 11.0–15.0)
WBC: 4.2 10*3/uL (ref 3.8–10.8)

## 2022-08-12 LAB — LIPID PANEL
Cholesterol: 233 mg/dL — ABNORMAL HIGH (ref <200)
HDL: 67 mg/dL (ref >=40)
LDL Cholesterol (Calc): 130 mg/dL (calc) — ABNORMAL HIGH
Non-HDL Cholesterol (Calc): 166 mg/dL (calc) — ABNORMAL HIGH (ref <130)
Total CHOL/HDL Ratio: 3.5 (calc) (ref <5.0)
Triglycerides: 217 mg/dL — ABNORMAL HIGH (ref <150)

## 2022-08-12 LAB — RPR: RPR Ser Ql: NONREACTIVE

## 2022-08-12 LAB — HIV-1 RNA QUANT-NO REFLEX-BLD
HIV 1 RNA Quant: <20 Copies/mL — ABNORMAL HIGH
HIV-1 RNA Quant, Log: <1.3 Log cps/mL — ABNORMAL HIGH

## 2023-02-09 ENCOUNTER — Other Ambulatory Visit: Payer: Self-pay

## 2023-02-09 ENCOUNTER — Ambulatory Visit: Payer: Self-pay

## 2023-02-22 ENCOUNTER — Other Ambulatory Visit: Payer: Self-pay

## 2023-02-22 DIAGNOSIS — B20 Human immunodeficiency virus [HIV] disease: Secondary | ICD-10-CM

## 2023-02-22 DIAGNOSIS — Z113 Encounter for screening for infections with a predominantly sexual mode of transmission: Secondary | ICD-10-CM

## 2023-02-24 ENCOUNTER — Ambulatory Visit: Payer: Self-pay

## 2023-02-24 ENCOUNTER — Other Ambulatory Visit: Payer: Self-pay

## 2023-02-24 DIAGNOSIS — Z113 Encounter for screening for infections with a predominantly sexual mode of transmission: Secondary | ICD-10-CM

## 2023-02-24 DIAGNOSIS — B20 Human immunodeficiency virus [HIV] disease: Secondary | ICD-10-CM

## 2023-02-24 NOTE — Addendum Note (Signed)
Addended by: Tressa Busman T on: 02/24/2023 03:24 PM   Modules accepted: Orders

## 2023-03-10 ENCOUNTER — Encounter: Payer: Self-pay | Admitting: Infectious Diseases

## 2023-03-10 NOTE — Progress Notes (Deleted)
[address], [place], [place], [address]                                                                  Phn. [phone]; Fax: [phone]                                                                             Date:   Reason for Visit: Routine HIV care.    HPI: Stephen Odom is a 47 y.o.old male with a history of HIV.  Last seen on   HIV Diagnosis in:  How it was diagnosed:  How it was contracted:   Prior ART: Prior Genotype CD4:  HIV VL: [id number]:  Toxo:  OI:  Prophylaxis Organism Indicated Treatment  PCP no  MAC no   Interval hx/current visit:  ROS: As stated in above HPI; all other systems were reviewed and are otherwise negative unless noted below  No reported fever / chills, night sweats, unintentional weight loss, acute visual change, odynophagia, chest pain/pressure, new or worsened SOB or WOB, nausea, vomiting, diarrhea, dysuria, GU discharge, syncope, seizures, red/hot swollen joints, hallucinations / delusions, rashes, new allergies, unusual / excessive bleeding, swollen lymph nodes, or new hospitalizations/ED visits/Urgent Care visits since the pt was last seen.  PMH/ PSH/ FamHx / Social Hx , medications and allergies reviewed and updated as appropriate; please see corresponding tab in EHR / prior notes                                        MEDS  Allergies  Allergen Reactions   Zovirax [Acyclovir] Nausea And Vomiting    PMH  PSH  Social  Family   There were no vitals taken for this visit.   Examination  Gen: no acute distress HEENT: [place]/AT, no scleral icterus, no pale conjunctivae, hearing normal, oral mucosa moist Neck: Supple Cardio: Regular rate and rhythm Resp: Pulmonary effort normal in room air GI:  nondistended GU: Musc: Extremities: No cyanosis, clubbing, or edema Skin: No rashes, lesions, or ecchymoses Neuro: grossly non focal , awake, alert and oriented * 3  Psych: Calm, cooperative    Lab Results HIV 1 RNA Quant (Copies/mL)  Date Value  02/24/2023 30 (H)  08/10/2022 <20 (H)  09/22/2021 <20 (H)   CD4 T Cell Abs (/uL)  Date Value  02/24/2023 536  08/10/2022 709  08/25/2020 488   Lab Results  Component Value Date   HIV1GENOSEQ REPORT 02/10/2016   Lab Results  Component Value Date   WBC 5.2 02/24/2023  HGB 13.6 02/24/2023   HCT 40.0 02/24/2023   MCV 83.5 02/24/2023   PLT 326 02/24/2023    Lab Results  Component Value Date   CREATININE 0.95 02/24/2023   BUN 13 02/24/2023   NA 138 02/24/2023   K 4.3 02/24/2023   CL 102 02/24/2023   CO2 28 02/24/2023   Lab Results  Component Value Date   ALT 8 (L) 02/24/2023   AST 13 02/24/2023   ALKPHOS 70 01/20/2018   BILITOT 0.6 02/24/2023    Lab Results  Component Value Date   CHOL 233 (H) 08/10/2022   TRIG 217 (H) 08/10/2022   HDL 67 08/10/2022   LDLCALC 130 (H) 08/10/2022   No results found for: "HAV" Lab Results  Component Value Date   HEPBSAG NEGATIVE 02/28/2015   HEPBSAB NEG 02/28/2015   Lab Results  Component Value Date   HCVAB NEGATIVE 02/28/2015   Lab Results  Component Value Date   CHLAMYDIAWP Negative 02/24/2023   N Negative 02/24/2023   No results found for: "GCPROBEAPT" Lab Results  Component Value Date   QUANTGOLD NEGATIVE 03/25/2015      Health Maintenance: Immunization History  Administered Date(s) Administered   COVID-19, mRNA, vaccine(Comirnaty)12 years and older 08/10/2022   Influenza Inj Mdck Quad Pf 04/17/2018   Influenza,inj,Quad PF,6+ Mos 04/09/2015, 05/20/2017, 05/29/2019, 05/13/2020, 08/10/2022   Influenza-Unspecified 05/22/2016   PFIZER(Purple Top)SARS-COV-2 Vaccination 02/22/2020, 03/21/2020, 04/18/2020   PPD Test 03/25/2015   Pfizer Covid-19 Vaccine Bivalent  Booster 50yrs & up 10/27/2021   Pneumococcal Conjugate-13 02/28/2015   Pneumococcal Polysaccharide-23 06/17/2015   Tdap 05/07/2018       Assessment/Plan: HIV Discussed with patient treatment options and side effects, benefits of treatment, long term outcomes.  Discussed the severity of untreated HIV including higher cancer risk, opportunistic infections, renal failure.  Discussed needing to use condoms, partner disclosure, necessary vaccines, blood monitoring.       Smoking/Alcohol/Illicit substance use    STD Screening   #Immunization  COVID Influenza Monkeypox Pneumococcal Meningitis HepA/Hep B HPV Tdap Zoster  #Health maintenance Hep C status Syphilis  TB testing  GC/Chlamydia Vitamin D  LDL OI ppx if indicated None  HLA B5701 Neg  Papsmear  Anal Pap if indicated Dental Care Mammogram Colonoscopy  Patient's labs were reviewed as well as his previous records. Patients questions were addressed and answered. Safe sex counseling done.   I have personally spent 60 minutes involved in face-to-face and non-face-to-face activities for this patient on the day of the visit. Professional time spent includes the following activities: Preparing to see the patient (review of tests), Obtaining and/or reviewing separately obtained history (admission/discharge record), Performing a medically appropriate examination and/or evaluation , Ordering medications/tests/procedures, referring and communicating with other health care professionals, Documenting clinical information in the EMR, Independently interpreting results (not separately reported), Communicating results to the patient/family/caregiver, Counseling and educating the patient/family/caregiver and Care coordination (not separately reported).    Electronically signed by:  Odette Fraction, MD Infectious Disease Physician [facility] [address] [place],  [place] [address] Phone: [phone]  Fax: [phone]

## 2023-03-14 ENCOUNTER — Telehealth: Payer: Self-pay

## 2023-03-14 NOTE — Telephone Encounter (Signed)
Attempted to call patient to reschedule follow up appt and review labs. Not able to reach patient at this time. Mychart message sent 8/15. Left voicemail requesting call back. Juanita Laster, RMA

## 2023-03-14 NOTE — Telephone Encounter (Signed)
-----   Message from Victoriano Lain sent at 03/14/2023  8:31 AM EDT ----- Please let patient know lab work without any acute abnormality. He seems to have missed office visit last week and would reschedule it.

## 2023-03-29 ENCOUNTER — Ambulatory Visit: Payer: Self-pay | Admitting: Internal Medicine

## 2023-03-29 NOTE — Progress Notes (Deleted)
[facility]     HPI: Stephen Odom is a 47 y.o. 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  Anal sex receptive***, insertive***. Contraception**** Oral sex, contraception*** Vaginal penile sex, contraception***  Social: Occupation: Housing: Support: Understanding of HIV: Etoh/drug/tobacco use:  Past Medical History:  Diagnosis Date   HIV infection (HCC) Dx 2015   Shingles     No past surgical history on file.  Family History  Problem Relation Age of Onset   Diabetes Mother    Hypertension Mother    Cancer Maternal Uncle        lung, stomach    Diabetes Maternal Grandmother    Diabetes Father    Hypertension Father    Current Outpatient Medications on File Prior to Visit  Medication Sig Dispense Refill   Darunavir-Cobicistat-Emtricitabine-Tenofovir Alafenamide (SYMTUZA) 800-150-200-10 MG TABS Take 1 tablet by mouth daily with breakfast. 30 tablet 11   No current facility-administered medications on file prior to visit.    Allergies  Allergen Reactions   Zovirax [Acyclovir] Nausea And Vomiting      Lab Results HIV 1 RNA Quant (Copies/mL)  Date Value  02/24/2023 30 (H)  08/10/2022 <20 (H)  09/22/2021 <20 (H)   CD4 T Cell Abs (/uL)  Date Value  02/24/2023 536  08/10/2022 709  08/25/2020 488   Lab Results  Component Value Date   HIV1GENOSEQ REPORT 02/10/2016   Lab Results  Component Value Date   WBC 5.2 02/24/2023   HGB 13.6 02/24/2023   HCT 40.0 02/24/2023   MCV 83.5 02/24/2023   PLT 326 02/24/2023    Lab Results  Component Value Date   CREATININE 0.95 02/24/2023   BUN 13 02/24/2023   NA 138 02/24/2023   K 4.3 02/24/2023   CL 102 02/24/2023   CO2 28 02/24/2023   Lab Results  Component Value Date   ALT 8 (L) 02/24/2023   AST 13 02/24/2023   ALKPHOS 70 01/20/2018   BILITOT 0.6 02/24/2023    Lab  Results  Component Value Date   CHOL 233 (H) 08/10/2022   TRIG 217 (H) 08/10/2022   HDL 67 08/10/2022   LDLCALC 130 (H) 08/10/2022   No results found for: "HAV" Lab Results  Component Value Date   HEPBSAG NEGATIVE 02/28/2015   HEPBSAB NEG 02/28/2015   Lab Results  Component Value Date   HCVAB NEGATIVE 02/28/2015   Lab Results  Component Value Date   CHLAMYDIAWP Negative 02/24/2023   N Negative 02/24/2023   No results found for: "GCPROBEAPT" Lab Results  Component Value Date   QUANTGOLD NEGATIVE 03/25/2015    Assessment/Plan #HIV -CD430, VL536, on  02/24/23  Plan -Symtuza -Follow up in   #Vaccination COVID Flu Monkeypox PCV Meningitis HepA serology today 03/28/23 HEpB serology today 03/28/23 Tdap Shingles  #Health maintenance -Quantiferon today 03/28/23 -RPR nr 02/24/23 -HCV today 03/28/23 -GC urine nr 02/24/23 -Lipid->needs to start statin The 10-year ASCVD risk score (Arnett DK, et al., 2019) is: 8.2%   Values used to calculate the score:     Age: 89 years     Sex: Male     Is Non-Hispanic African American: Yes     Diabetic: No     Tobacco smoker: Yes     Systolic Blood Pressure: 131 mmHg     Is BP treated: No     HDL  Cholesterol: 67 mg/dL     Total Cholesterol: 233 mg/dL  -Dysplasia screen M today -Colonoscopy    Danelle Earthly, MD [facility]

## 2023-08-11 ENCOUNTER — Ambulatory Visit: Payer: Self-pay | Admitting: Internal Medicine

## 2023-08-11 ENCOUNTER — Ambulatory Visit: Payer: Self-pay

## 2023-08-15 ENCOUNTER — Other Ambulatory Visit: Payer: Self-pay

## 2023-08-15 ENCOUNTER — Ambulatory Visit (INDEPENDENT_AMBULATORY_CARE_PROVIDER_SITE_OTHER): Payer: Self-pay | Admitting: Internal Medicine

## 2023-08-15 ENCOUNTER — Encounter: Payer: Self-pay | Admitting: Internal Medicine

## 2023-08-15 ENCOUNTER — Ambulatory Visit: Payer: Self-pay

## 2023-08-15 ENCOUNTER — Other Ambulatory Visit (HOSPITAL_COMMUNITY): Payer: Self-pay

## 2023-08-15 VITALS — BP 111/71 | HR 67 | Temp 97.8°F | Ht 67.0 in | Wt 158.0 lb

## 2023-08-15 DIAGNOSIS — Z23 Encounter for immunization: Secondary | ICD-10-CM

## 2023-08-15 DIAGNOSIS — B20 Human immunodeficiency virus [HIV] disease: Secondary | ICD-10-CM

## 2023-08-15 DIAGNOSIS — Z113 Encounter for screening for infections with a predominantly sexual mode of transmission: Secondary | ICD-10-CM

## 2023-08-15 DIAGNOSIS — Z129 Encounter for screening for malignant neoplasm, site unspecified: Secondary | ICD-10-CM

## 2023-08-15 DIAGNOSIS — Z1159 Encounter for screening for other viral diseases: Secondary | ICD-10-CM

## 2023-08-15 MED ORDER — BICTEGRAVIR-EMTRICITAB-TENOFOV 50-200-25 MG PO TABS: 1.0000 | ORAL_TABLET | Freq: Every day | ORAL | 11 refills | Status: AC

## 2023-08-15 NOTE — Patient Instructions (Signed)
Vaccine  -flu -covid      Meds: Stop symtuza Start biktarvy     See me in 6 months for recheck viral load    Anal pap and labs today

## 2023-08-15 NOTE — Progress Notes (Signed)
Patient Active Problem List   Diagnosis Date Noted   Former smoker 05/24/2016   Screening for STD (sexually transmitted disease) 02/28/2015   Shingles 02/28/2015   HIV positive (HCC) 12/31/2013    Patient's Medications  New Prescriptions   No medications on file  Previous Medications   DARUNAVIR-COBICISTAT-EMTRICITABINE-TENOFOVIR ALAFENAMIDE (SYMTUZA) 800-150-200-10 MG TABS    Take 1 tablet by mouth daily with breakfast.  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  Psychiatric/Behavioral:  Negative for depression. The patient is not nervous/anxious.     Past Medical History:  Diagnosis Date   HIV infection (HCC) Dx 2015   Shingles     Social History   Tobacco Use   Smoking status: Light Smoker    Types: E-cigarettes   Smokeless tobacco: Never   Tobacco comments:    light   Vaping Use   Vaping status: Never Used  Substance Use Topics   Alcohol use: Yes    Alcohol/week: 24.0 standard drinks of alcohol    Types: 24 Standard drinks or equivalent per week    Comment: weekends only   Drug use: Yes    Types: Marijuana    Comment: occ    Family History  Problem Relation Age of Onset   Diabetes Mother    Hypertension Mother    Cancer Maternal Uncle        lung, stomach    Diabetes Maternal Grandmother    Diabetes Father    Hypertension Father     Allergies  Allergen Reactions   Zovirax  [Acyclovir] Nausea And Vomiting    Health Maintenance  Topic Date Due   Colonoscopy  Never done   Pneumococcal Vaccine 72-76 Years old (3 of 3 - PPSV23 or PCV20) 06/16/2020   INFLUENZA VACCINE  02/24/2023   COVID-19 Vaccine (6 - 2024-25 season) 03/27/2023   DTaP/Tdap/Td (2 - Td or Tdap) 05/07/2028   Hepatitis C Screening  Completed   HIV Screening  Completed   HPV VACCINES  Aged Out    Objective:  There were no vitals filed for this visit.  There is no height or weight on file to calculate BMI.  Physical Exam Constitutional:      Comments: His spirits are good.  Cardiovascular:     Rate and Rhythm: Normal rate and regular rhythm.     Heart sounds: No murmur heard. Pulmonary:     Effort: Pulmonary effort is normal.     Breath sounds: Normal breath sounds.  Psychiatric:        Mood and Affect: Mood normal.     Lab Results Lab Results  Component Value Date   WBC 5.2 02/24/2023   HGB 13.6 02/24/2023   HCT 40.0 02/24/2023   MCV 83.5 02/24/2023   PLT 326 02/24/2023    Lab Results  Component Value Date   CREATININE 0.95 02/24/2023   BUN 13 02/24/2023   NA 138 02/24/2023   K 4.3 02/24/2023   CL 102 02/24/2023   CO2 28 02/24/2023    Lab Results  Component Value Date   ALT 8 (L) 02/24/2023   AST 13 02/24/2023   ALKPHOS 70 01/20/2018   BILITOT 0.6 02/24/2023    Lab Results  Component Value Date   CHOL 233 (H) 08/10/2022   HDL 67 08/10/2022   LDLCALC 130 (H) 08/10/2022   TRIG 217 (H) 08/10/2022   CHOLHDL 3.5 08/10/2022   Lab Results  Component Value Date   LABRPR NON-REACTIVE 02/24/2023   HIV 1 RNA Quant (Copies/mL)  Date Value  02/24/2023 30 (H)  08/10/2022 <20 (H)  09/22/2021 <20 (H)   CD4 T Cell Abs (/uL)  Date Value  02/24/2023 536  08/10/2022 709  08/25/2020 488     Problem List Items Addressed This Visit   None Visit Diagnoses       Screening for cancer    -  Primary   Relevant Orders   Cytology - PAP     Screening for STDs  (sexually transmitted diseases)       Relevant Orders   RPR   Urine cytology ancillary only([place])   Cytology (oral, anal, urethral) ancillary only   Cytology (oral, anal, urethral) ancillary only     Need for hepatitis B screening test         Encounter for hepatitis C screening test for low risk patient         HIV disease (HCC)       Relevant Medications   bictegravir-emtricitabine-tenofovir AF (BIKTARVY) 50-200-25 MG TABS tablet   Other Relevant Orders   HIV 1 RNA quant-no reflex-bld   CBC   COMPLETE METABOLIC PANEL WITH GFR   T-helper cells (CD4) count       #hiv [facility] Dx'ed 2015 Cd4 nadir 200 Therapy: 2020-c symtuza 2017-2020 genvoya  Has been well controlled on symtuza. 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 He is ok for me to switch to biktarvy    -discussed u=u -encourage compliance -stop symtuza -start biktarvy -labs today -f/u in 6 months   #hcm -vaccination Prevnar 2016 Tdap 2019 Wants flu/covid shot today -hepatitis  2016 hep b serology all negative; hep c ab negative I don't see hep b vaccination on file 08/15/23 hep b and c serology -std screen Triple screen and rpr today 08/15/23 -tb screen 2016 quantiferon gold negative -cancer screening Anal pap smear today 08/15/23    Raymondo Band, MD [facility] [phone] pager   [phone] cell 08/15/2023, 3:36 PM

## 2023-08-15 NOTE — Addendum Note (Signed)
Addended by: Rutha Bouchard T on: 08/15/2023 03:56 PM   Modules accepted: Orders

## 2023-08-16 LAB — T-HELPER CELLS (CD4) COUNT (NOT AT ARMC)
CD4 % Helper T Cell: 38 % (ref 33–65)
CD4 T Cell Abs: 679 /uL (ref 400–1790)

## 2023-08-17 ENCOUNTER — Other Ambulatory Visit: Payer: Self-pay | Admitting: Pharmacist

## 2023-08-17 DIAGNOSIS — B20 Human immunodeficiency virus [HIV] disease: Secondary | ICD-10-CM

## 2023-08-17 LAB — CYTOLOGY, (ORAL, ANAL, URETHRAL) ANCILLARY ONLY
Chlamydia: NEGATIVE
Chlamydia: NEGATIVE
Comment: NEGATIVE
Comment: NEGATIVE
Comment: NORMAL
Comment: NORMAL
Neisseria Gonorrhea: NEGATIVE
Neisseria Gonorrhea: NEGATIVE

## 2023-08-17 LAB — COMPLETE METABOLIC PANEL WITH GFR
AG Ratio: 1.7 (calc) (ref 1.0–2.5)
ALT: 6 U/L — ABNORMAL LOW (ref 9–46)
AST: 13 U/L (ref 10–40)
Albumin: 4.3 g/dL (ref 3.6–5.1)
Alkaline phosphatase (APISO): 77 U/L (ref 36–130)
BUN: 14 mg/dL (ref 7–25)
CO2: 29 mmol/L (ref 20–32)
Calcium: 9.5 mg/dL (ref 8.6–10.3)
Chloride: 107 mmol/L (ref 98–110)
Creat: 0.83 mg/dL (ref 0.60–1.29)
Globulin: 2.6 g/dL (ref 1.9–3.7)
Glucose, Bld: 80 mg/dL (ref 65–99)
Potassium: 4.7 mmol/L (ref 3.5–5.3)
Sodium: 141 mmol/L (ref 135–146)
Total Bilirubin: 0.3 mg/dL (ref 0.2–1.2)
Total Protein: 6.9 g/dL (ref 6.1–8.1)
eGFR: 107 mL/min/{1.73_m2} (ref >=60)

## 2023-08-17 LAB — RPR: RPR Ser Ql: NONREACTIVE

## 2023-08-17 LAB — CBC
HCT: 44.8 % (ref 38.5–50.0)
Hemoglobin: 14.4 g/dL (ref 13.2–17.1)
MCH: 27.8 pg (ref 27.0–33.0)
MCHC: 32.1 g/dL (ref 32.0–36.0)
MCV: 86.5 fL (ref 80.0–100.0)
MPV: 8.8 fL (ref 7.5–12.5)
Platelets: 310 10*3/uL (ref 140–400)
RBC: 5.18 10*6/uL (ref 4.20–5.80)
RDW: 12.7 % (ref 11.0–15.0)
WBC: 3.9 10*3/uL (ref 3.8–10.8)

## 2023-08-17 LAB — URINE CYTOLOGY ANCILLARY ONLY
Chlamydia: NEGATIVE
Comment: NEGATIVE
Comment: NEGATIVE
Comment: NORMAL
Neisseria Gonorrhea: NEGATIVE
Trichomonas: NEGATIVE

## 2023-08-17 LAB — HIV-1 RNA QUANT-NO REFLEX-BLD
HIV 1 RNA Quant: 29 {copies}/mL — ABNORMAL HIGH
HIV-1 RNA Quant, Log: 1.46 {Log} — ABNORMAL HIGH

## 2023-08-17 MED ORDER — BIKTARVY 50-200-25 MG PO TABS: 1.0000 | ORAL_TABLET | Freq: Every day | ORAL | Status: AC

## 2023-08-17 NOTE — Progress Notes (Signed)
Medication Samples have been provided to the patient.  Drug name: Biktarvy        Strength: 50/200/25 mg       Qty: 1 bottle (7 days)   LOT: CSCFVA   Exp.Date: 04/2025  Samples requested by Burgess Amor (financial).  Dosing instructions: Take one tablet by mouth once daily  The patient has been instructed regarding the correct time, dose, and frequency of taking this medication, including desired effects and most common side effects.   Renald Haithcock L. Jannette Fogo, PharmD, BCIDP, AAHIVP, CPP Clinical Pharmacist Practitioner Infectious Diseases Clinical Pharmacist [facility] 07/07/2020, 10:07 AM

## 2023-08-18 ENCOUNTER — Encounter: Payer: Self-pay | Admitting: Internal Medicine

## 2023-08-19 LAB — CYTOLOGY - PAP
Adequacy: ABSENT
Diagnosis: NEGATIVE

## 2023-08-22 ENCOUNTER — Other Ambulatory Visit (HOSPITAL_COMMUNITY): Payer: Self-pay

## 2023-08-26 ENCOUNTER — Other Ambulatory Visit: Payer: Self-pay | Admitting: Pharmacist

## 2023-08-26 MED ORDER — BICTEGRAVIR-EMTRICITAB-TENOFOV 50-200-25 MG PO TABS: 1.0000 | ORAL_TABLET | Freq: Every day | ORAL | Status: AC

## 2023-08-26 NOTE — Progress Notes (Signed)
Medication Samples have been provided to the patient.  Drug name: Biktarvy        Strength: 50/200/25 mg       Qty: 7 tablets (1 bottles) LOT: CSCFVA   Exp.Date: 10/26  Dosing instructions: Take one tablet by mouth once daily  The patient has been instructed regarding the correct time, dose, and frequency of taking this medication, including desired effects and most common side effects.   Margarite Gouge, PharmD, CPP, BCIDP, AAHIVP Clinical Pharmacist Practitioner Infectious Diseases Clinical Pharmacist [facility]

## 2024-01-25 ENCOUNTER — Other Ambulatory Visit: Payer: Self-pay | Admitting: Medical Genetics

## 2024-03-15 ENCOUNTER — Encounter: Payer: Self-pay | Admitting: Internal Medicine

## 2024-03-15 DIAGNOSIS — B20 Human immunodeficiency virus [HIV] disease: Secondary | ICD-10-CM

## 2024-05-16 ENCOUNTER — Other Ambulatory Visit: Payer: Self-pay | Admitting: Medical Genetics

## 2024-05-16 DIAGNOSIS — Z006 Encounter for examination for normal comparison and control in clinical research program: Secondary | ICD-10-CM

## 2024-06-25 NOTE — Progress Notes (Signed)
 The 10-year ASCVD risk score (Arnett DK, et al., 2019) is: 4%   Values used to calculate the score:     Age: 48 years     Clincally relevant sex: Male     Is Non-Hispanic African American: Yes     Diabetic: No     Tobacco smoker: No     Systolic Blood Pressure: 117 mmHg     Is BP treated: No     HDL Cholesterol: 75 mg/dL     Total Cholesterol: 202 mg/dL  ASCVD < 5%  Duwaine Lowe, BSN, [facility]
